# Patient Record
Sex: Male | Born: 1935 | Race: White | Hispanic: No | Marital: Married | Smoking: Never smoker
Health system: Southern US, Community
[De-identification: ages and names within clinical notes are randomized; demographics above are authoritative.]

## PROBLEM LIST (undated history)

## (undated) DIAGNOSIS — M109 Gout, unspecified: Secondary | ICD-10-CM

## (undated) DIAGNOSIS — K921 Melena: Secondary | ICD-10-CM

## (undated) DIAGNOSIS — H409 Unspecified glaucoma: Secondary | ICD-10-CM

## (undated) DIAGNOSIS — M1990 Unspecified osteoarthritis, unspecified site: Secondary | ICD-10-CM

## (undated) DIAGNOSIS — I10 Essential (primary) hypertension: Secondary | ICD-10-CM

## (undated) DIAGNOSIS — E785 Hyperlipidemia, unspecified: Secondary | ICD-10-CM

## (undated) DIAGNOSIS — K5731 Diverticulosis of large intestine without perforation or abscess with bleeding: Secondary | ICD-10-CM

## (undated) HISTORY — DX: Melena: K92.1

## (undated) HISTORY — PX: OTHER SURGICAL HISTORY: SHX169

## (undated) HISTORY — PX: HERNIA REPAIR: SHX51

## (undated) HISTORY — DX: Diverticulosis of large intestine without perforation or abscess with bleeding: K57.31

---

## 2012-12-14 DEATH — deceased

## 2015-11-08 DIAGNOSIS — M51369 Other intervertebral disc degeneration, lumbar region without mention of lumbar back pain or lower extremity pain: Secondary | ICD-10-CM | POA: Insufficient documentation

## 2015-11-08 DIAGNOSIS — R7303 Prediabetes: Secondary | ICD-10-CM

## 2015-11-08 DIAGNOSIS — R972 Elevated prostate specific antigen [PSA]: Secondary | ICD-10-CM | POA: Insufficient documentation

## 2015-11-08 HISTORY — DX: Elevated prostate specific antigen (PSA): R97.20

## 2015-11-08 HISTORY — DX: Prediabetes: R73.03

## 2015-11-08 HISTORY — DX: Other intervertebral disc degeneration, lumbar region without mention of lumbar back pain or lower extremity pain: M51.369

## 2015-11-12 DIAGNOSIS — H5509 Other forms of nystagmus: Secondary | ICD-10-CM

## 2015-11-12 HISTORY — DX: Other forms of nystagmus: H55.09

## 2015-12-24 DIAGNOSIS — G450 Vertebro-basilar artery syndrome: Secondary | ICD-10-CM

## 2015-12-24 DIAGNOSIS — G459 Transient cerebral ischemic attack, unspecified: Secondary | ICD-10-CM | POA: Insufficient documentation

## 2015-12-24 HISTORY — DX: Transient cerebral ischemic attack, unspecified: G45.9

## 2015-12-24 HISTORY — DX: Vertebro-basilar artery syndrome: G45.0

## 2016-02-24 DIAGNOSIS — N401 Enlarged prostate with lower urinary tract symptoms: Secondary | ICD-10-CM

## 2016-02-24 HISTORY — DX: Benign prostatic hyperplasia with lower urinary tract symptoms: N40.1

## 2016-05-11 DIAGNOSIS — K429 Umbilical hernia without obstruction or gangrene: Secondary | ICD-10-CM

## 2016-05-11 DIAGNOSIS — N529 Male erectile dysfunction, unspecified: Secondary | ICD-10-CM

## 2016-05-11 DIAGNOSIS — J309 Allergic rhinitis, unspecified: Secondary | ICD-10-CM | POA: Insufficient documentation

## 2016-05-11 HISTORY — DX: Allergic rhinitis, unspecified: J30.9

## 2016-05-11 HISTORY — DX: Umbilical hernia without obstruction or gangrene: K42.9

## 2016-05-11 HISTORY — DX: Male erectile dysfunction, unspecified: N52.9

## 2016-06-09 DIAGNOSIS — H3530 Unspecified macular degeneration: Secondary | ICD-10-CM

## 2016-06-09 HISTORY — DX: Unspecified macular degeneration: H35.30

## 2016-11-16 DIAGNOSIS — R5383 Other fatigue: Secondary | ICD-10-CM | POA: Insufficient documentation

## 2016-11-16 HISTORY — DX: Other fatigue: R53.83

## 2016-11-17 DIAGNOSIS — D539 Nutritional anemia, unspecified: Secondary | ICD-10-CM | POA: Insufficient documentation

## 2016-11-17 HISTORY — DX: Nutritional anemia, unspecified: D53.9

## 2017-10-06 ENCOUNTER — Encounter (HOSPITAL_COMMUNITY): Payer: Self-pay | Admitting: Internal Medicine

## 2017-10-06 ENCOUNTER — Other Ambulatory Visit: Payer: Self-pay

## 2017-10-06 ENCOUNTER — Inpatient Hospital Stay (HOSPITAL_COMMUNITY)
Admission: AD | Admit: 2017-10-06 | Discharge: 2017-10-09 | DRG: 378 | Disposition: A | Discharge status: Home / Self Care | Payer: Medicare HMO | Source: Other Acute Inpatient Hospital | Attending: Internal Medicine | Admitting: Internal Medicine

## 2017-10-06 DIAGNOSIS — I10 Essential (primary) hypertension: Secondary | ICD-10-CM | POA: Diagnosis present

## 2017-10-06 DIAGNOSIS — I119 Hypertensive heart disease without heart failure: Secondary | ICD-10-CM | POA: Diagnosis present

## 2017-10-06 DIAGNOSIS — K222 Esophageal obstruction: Secondary | ICD-10-CM | POA: Diagnosis present

## 2017-10-06 DIAGNOSIS — K219 Gastro-esophageal reflux disease without esophagitis: Secondary | ICD-10-CM | POA: Diagnosis present

## 2017-10-06 DIAGNOSIS — K922 Gastrointestinal hemorrhage, unspecified: Secondary | ICD-10-CM

## 2017-10-06 DIAGNOSIS — I739 Peripheral vascular disease, unspecified: Secondary | ICD-10-CM | POA: Diagnosis present

## 2017-10-06 DIAGNOSIS — E785 Hyperlipidemia, unspecified: Secondary | ICD-10-CM | POA: Diagnosis present

## 2017-10-06 DIAGNOSIS — K5710 Diverticulosis of small intestine without perforation or abscess without bleeding: Secondary | ICD-10-CM | POA: Diagnosis not present

## 2017-10-06 DIAGNOSIS — I700 Atherosclerosis of aorta: Secondary | ICD-10-CM | POA: Diagnosis present

## 2017-10-06 DIAGNOSIS — K648 Other hemorrhoids: Secondary | ICD-10-CM | POA: Diagnosis not present

## 2017-10-06 DIAGNOSIS — N400 Enlarged prostate without lower urinary tract symptoms: Secondary | ICD-10-CM | POA: Diagnosis not present

## 2017-10-06 DIAGNOSIS — I2510 Atherosclerotic heart disease of native coronary artery without angina pectoris: Secondary | ICD-10-CM | POA: Diagnosis present

## 2017-10-06 DIAGNOSIS — K5791 Diverticulosis of intestine, part unspecified, without perforation or abscess with bleeding: Principal | ICD-10-CM | POA: Diagnosis present

## 2017-10-06 DIAGNOSIS — Z8673 Personal history of transient ischemic attack (TIA), and cerebral infarction without residual deficits: Secondary | ICD-10-CM

## 2017-10-06 DIAGNOSIS — Z7902 Long term (current) use of antithrombotics/antiplatelets: Secondary | ICD-10-CM

## 2017-10-06 DIAGNOSIS — Z803 Family history of malignant neoplasm of breast: Secondary | ICD-10-CM | POA: Diagnosis not present

## 2017-10-06 DIAGNOSIS — K449 Diaphragmatic hernia without obstruction or gangrene: Secondary | ICD-10-CM | POA: Diagnosis present

## 2017-10-06 DIAGNOSIS — M1990 Unspecified osteoarthritis, unspecified site: Secondary | ICD-10-CM | POA: Diagnosis present

## 2017-10-06 DIAGNOSIS — K5731 Diverticulosis of large intestine without perforation or abscess with bleeding: Secondary | ICD-10-CM | POA: Diagnosis not present

## 2017-10-06 DIAGNOSIS — H409 Unspecified glaucoma: Secondary | ICD-10-CM | POA: Diagnosis present

## 2017-10-06 DIAGNOSIS — K921 Melena: Secondary | ICD-10-CM

## 2017-10-06 DIAGNOSIS — M109 Gout, unspecified: Secondary | ICD-10-CM | POA: Diagnosis present

## 2017-10-06 DIAGNOSIS — D62 Acute posthemorrhagic anemia: Secondary | ICD-10-CM | POA: Diagnosis present

## 2017-10-06 DIAGNOSIS — Z7901 Long term (current) use of anticoagulants: Secondary | ICD-10-CM | POA: Diagnosis not present

## 2017-10-06 DIAGNOSIS — I959 Hypotension, unspecified: Secondary | ICD-10-CM | POA: Diagnosis present

## 2017-10-06 HISTORY — DX: Unspecified glaucoma: H40.9

## 2017-10-06 HISTORY — DX: Unspecified osteoarthritis, unspecified site: M19.90

## 2017-10-06 HISTORY — DX: Essential (primary) hypertension: I10

## 2017-10-06 LAB — COMPREHENSIVE METABOLIC PANEL
ALBUMIN: 3.1 g/dL — AB (ref 3.5–5.0)
ALT: 14 U/L — ABNORMAL LOW (ref 17–63)
ANION GAP: 8 (ref 5–15)
AST: 20 U/L (ref 15–41)
Alkaline Phosphatase: 72 U/L (ref 38–126)
BILIRUBIN TOTAL: 0.7 mg/dL (ref 0.3–1.2)
BUN: 30 mg/dL — ABNORMAL HIGH (ref 6–20)
CO2: 20 mmol/L — ABNORMAL LOW (ref 22–32)
Calcium: 8.1 mg/dL — ABNORMAL LOW (ref 8.9–10.3)
Chloride: 107 mmol/L (ref 101–111)
Creatinine, Ser: 1.06 mg/dL (ref 0.61–1.24)
GFR calc Af Amer: >60 mL/min (ref >60)
GLUCOSE: 140 mg/dL — AB (ref 65–99)
POTASSIUM: 4.2 mmol/L (ref 3.5–5.1)
Sodium: 135 mmol/L (ref 135–145)
TOTAL PROTEIN: 5.2 g/dL — AB (ref 6.5–8.1)

## 2017-10-06 LAB — CBC WITH DIFFERENTIAL/PLATELET
Basophils Absolute: 0 10*3/uL (ref 0.0–0.1)
Basophils Relative: 0 %
Eosinophils Absolute: 0 10*3/uL (ref 0.0–0.7)
Eosinophils Relative: 0 %
HEMATOCRIT: 31.2 % — AB (ref 39.0–52.0)
Hemoglobin: 10.5 g/dL — ABNORMAL LOW (ref 13.0–17.0)
Lymphocytes Relative: 10 %
Lymphs Abs: 1.3 10*3/uL (ref 0.7–4.0)
MCH: 31.5 pg (ref 26.0–34.0)
MCHC: 33.7 g/dL (ref 30.0–36.0)
MCV: 93.7 fL (ref 78.0–100.0)
MONO ABS: 0.4 10*3/uL (ref 0.1–1.0)
MONOS PCT: 3 %
NEUTROS ABS: 11.2 10*3/uL — AB (ref 1.7–7.7)
Neutrophils Relative %: 87 %
Platelets: 181 10*3/uL (ref 150–400)
RBC: 3.33 MIL/uL — ABNORMAL LOW (ref 4.22–5.81)
RDW: 13.6 % (ref 11.5–15.5)
WBC: 12.9 10*3/uL — ABNORMAL HIGH (ref 4.0–10.5)

## 2017-10-06 LAB — LACTIC ACID, PLASMA: LACTIC ACID, VENOUS: 1.3 mmol/L (ref 0.5–1.9)

## 2017-10-06 LAB — PROTIME-INR
INR: 1.11
PROTHROMBIN TIME: 14.2 s (ref 11.4–15.2)

## 2017-10-06 LAB — ABO/RH: ABO/RH(D): AB POS

## 2017-10-06 NOTE — Progress Notes (Signed)
New Admission Note:  Arrival Method: Via [facility] on a stretcher.  Mental Orientation: Alert & Oriented x4. Telemetry: CCMD verified.  Assessment: Completed Skin: Refer to flowsheet.  IV: Left Forearm Pain: 0/10 Tubes: Safety Measures: Safety Fall Prevention Plan discussed with patient. Admission: Completed [facility] [place] Orientation: Patient has been orientated to the room, unit and the staff. Family: Son at the bedside.   Orders have been reviewed and implemented. Will continue to monitor the patient. Call light has been placed within reach and bed alarm has been activated.   Aram CandelaJequetta Cole Eastridge, RN  Phone Number: [phone]

## 2017-10-07 ENCOUNTER — Encounter (HOSPITAL_COMMUNITY)
Admission: AD | Disposition: A | Discharge status: Home / Self Care | Payer: Self-pay | Source: Other Acute Inpatient Hospital | Attending: Internal Medicine

## 2017-10-07 ENCOUNTER — Inpatient Hospital Stay (HOSPITAL_COMMUNITY): Payer: Medicare HMO

## 2017-10-07 ENCOUNTER — Encounter (HOSPITAL_COMMUNITY): Payer: Self-pay | Admitting: Internal Medicine

## 2017-10-07 ENCOUNTER — Inpatient Hospital Stay (HOSPITAL_COMMUNITY): Payer: Medicare HMO | Admitting: Certified Registered Nurse Anesthetist

## 2017-10-07 DIAGNOSIS — K449 Diaphragmatic hernia without obstruction or gangrene: Secondary | ICD-10-CM

## 2017-10-07 DIAGNOSIS — D62 Acute posthemorrhagic anemia: Secondary | ICD-10-CM

## 2017-10-07 DIAGNOSIS — E785 Hyperlipidemia, unspecified: Secondary | ICD-10-CM

## 2017-10-07 DIAGNOSIS — M109 Gout, unspecified: Secondary | ICD-10-CM | POA: Diagnosis present

## 2017-10-07 DIAGNOSIS — K922 Gastrointestinal hemorrhage, unspecified: Secondary | ICD-10-CM

## 2017-10-07 DIAGNOSIS — I10 Essential (primary) hypertension: Secondary | ICD-10-CM | POA: Diagnosis present

## 2017-10-07 DIAGNOSIS — K222 Esophageal obstruction: Secondary | ICD-10-CM

## 2017-10-07 DIAGNOSIS — Z7901 Long term (current) use of anticoagulants: Secondary | ICD-10-CM

## 2017-10-07 HISTORY — DX: Acute posthemorrhagic anemia: D62

## 2017-10-07 HISTORY — DX: Hyperlipidemia, unspecified: E78.5

## 2017-10-07 HISTORY — DX: Gastrointestinal hemorrhage, unspecified: K92.2

## 2017-10-07 HISTORY — PX: ESOPHAGOGASTRODUODENOSCOPY: SHX5428

## 2017-10-07 LAB — GLUCOSE, CAPILLARY
GLUCOSE-CAPILLARY: 142 mg/dL — AB (ref 65–99)
Glucose-Capillary: 123 mg/dL — ABNORMAL HIGH (ref 65–99)
Glucose-Capillary: 129 mg/dL — ABNORMAL HIGH (ref 65–99)
Glucose-Capillary: 140 mg/dL — ABNORMAL HIGH (ref 65–99)
Glucose-Capillary: 147 mg/dL — ABNORMAL HIGH (ref 65–99)

## 2017-10-07 LAB — CBC
HCT: 28.8 % — ABNORMAL LOW (ref 39.0–52.0)
HCT: 26 % — ABNORMAL LOW (ref 39.0–52.0)
HCT: 25.2 % — ABNORMAL LOW (ref 39.0–52.0)
HEMATOCRIT: 26 % — AB (ref 39.0–52.0)
HEMOGLOBIN: 9.8 g/dL — AB (ref 13.0–17.0)
HEMOGLOBIN: 8.6 g/dL — AB (ref 13.0–17.0)
Hemoglobin: 9.1 g/dL — ABNORMAL LOW (ref 13.0–17.0)
Hemoglobin: 8.5 g/dL — ABNORMAL LOW (ref 13.0–17.0)
MCH: 31.7 pg (ref 26.0–34.0)
MCH: 33 pg (ref 26.0–34.0)
MCH: 31.8 pg (ref 26.0–34.0)
MCH: 31.5 pg (ref 26.0–34.0)
MCHC: 34 g/dL (ref 30.0–36.0)
MCHC: 35 g/dL (ref 30.0–36.0)
MCHC: 33.7 g/dL (ref 30.0–36.0)
MCHC: 33.1 g/dL (ref 30.0–36.0)
MCV: 93.2 fL (ref 78.0–100.0)
MCV: 94.2 fL (ref 78.0–100.0)
MCV: 94.4 fL (ref 78.0–100.0)
MCV: 95.2 fL (ref 78.0–100.0)
PLATELETS: 191 10*3/uL (ref 150–400)
PLATELETS: 170 10*3/uL (ref 150–400)
PLATELETS: 172 10*3/uL (ref 150–400)
Platelets: 170 10*3/uL (ref 150–400)
RBC: 3.09 MIL/uL — ABNORMAL LOW (ref 4.22–5.81)
RBC: 2.76 MIL/uL — AB (ref 4.22–5.81)
RBC: 2.67 MIL/uL — AB (ref 4.22–5.81)
RBC: 2.73 MIL/uL — ABNORMAL LOW (ref 4.22–5.81)
RDW: 13.7 % (ref 11.5–15.5)
RDW: 14 % (ref 11.5–15.5)
RDW: 13.7 % (ref 11.5–15.5)
RDW: 13.9 % (ref 11.5–15.5)
WBC: 14.1 10*3/uL — ABNORMAL HIGH (ref 4.0–10.5)
WBC: 12.4 10*3/uL — ABNORMAL HIGH (ref 4.0–10.5)
WBC: 13 10*3/uL — AB (ref 4.0–10.5)
WBC: 11.8 10*3/uL — AB (ref 4.0–10.5)

## 2017-10-07 LAB — BASIC METABOLIC PANEL
ANION GAP: 10 (ref 5–15)
BUN: 33 mg/dL — ABNORMAL HIGH (ref 6–20)
CO2: 19 mmol/L — ABNORMAL LOW (ref 22–32)
Calcium: 8.4 mg/dL — ABNORMAL LOW (ref 8.9–10.3)
Chloride: 110 mmol/L (ref 101–111)
Creatinine, Ser: 1.04 mg/dL (ref 0.61–1.24)
GFR calc Af Amer: >60 mL/min (ref >60)
GLUCOSE: 144 mg/dL — AB (ref 65–99)
Potassium: 4 mmol/L (ref 3.5–5.1)
Sodium: 139 mmol/L (ref 135–145)

## 2017-10-07 LAB — PREPARE RBC (CROSSMATCH)

## 2017-10-07 SURGERY — EGD (ESOPHAGOGASTRODUODENOSCOPY)
Anesthesia: Monitor Anesthesia Care

## 2017-10-07 MED ORDER — ALLOPURINOL 300 MG PO TABS
300.0000 mg | ORAL_TABLET | Freq: Two times a day (BID) | ORAL | Status: DC
  Administered 2017-10-07 – 2017-10-09 (×5): 300 mg via ORAL
  Filled 2017-10-07 (×5): qty 1

## 2017-10-07 MED ORDER — PROPOFOL 500 MG/50ML IV EMUL
INTRAVENOUS | Status: DC | PRN
  Administered 2017-10-07: 85 ug/kg/min via INTRAVENOUS

## 2017-10-07 MED ORDER — PEG-KCL-NACL-NASULF-NA ASC-C 100 G PO SOLR
0.5000 | Freq: Once | ORAL | Status: AC
  Administered 2017-10-07: 100 g via ORAL
  Filled 2017-10-07: qty 1

## 2017-10-07 MED ORDER — ACETAMINOPHEN 325 MG PO TABS: 650.0000 mg | ORAL_TABLET | Freq: Four times a day (QID) | ORAL | Status: DC | PRN

## 2017-10-07 MED ORDER — LABETALOL HCL 300 MG PO TABS: 300.0000 mg | ORAL_TABLET | Freq: Two times a day (BID) | ORAL | Status: DC

## 2017-10-07 MED ORDER — BRIMONIDINE TARTRATE 0.2 % OP SOLN
1.0000 [drp] | Freq: Two times a day (BID) | OPHTHALMIC | Status: DC
  Administered 2017-10-07 – 2017-10-09 (×5): 1 [drp] via OPHTHALMIC
  Filled 2017-10-07: qty 5

## 2017-10-07 MED ORDER — ONDANSETRON HCL 4 MG PO TABS: 4.0000 mg | ORAL_TABLET | Freq: Four times a day (QID) | ORAL | Status: DC | PRN

## 2017-10-07 MED ORDER — PEG-KCL-NACL-NASULF-NA ASC-C 100 G PO SOLR: 1.0000 | Freq: Once | ORAL | Status: DC

## 2017-10-07 MED ORDER — LACTATED RINGERS IV SOLN
INTRAVENOUS | Status: AC | PRN
  Administered 2017-10-07: 11:00:00 via INTRAVENOUS
  Administered 2017-10-07: 1000 mL via INTRAVENOUS

## 2017-10-07 MED ORDER — HYDRALAZINE HCL 20 MG/ML IJ SOLN: 10.0000 mg | INTRAMUSCULAR | Status: DC | PRN

## 2017-10-07 MED ORDER — LATANOPROST 0.005 % OP SOLN
1.0000 [drp] | Freq: Every day | OPHTHALMIC | Status: DC
  Administered 2017-10-07 – 2017-10-08 (×2): 1 [drp] via OPHTHALMIC
  Filled 2017-10-07: qty 2.5

## 2017-10-07 MED ORDER — ONDANSETRON HCL 4 MG/2ML IJ SOLN: 4.0000 mg | Freq: Four times a day (QID) | INTRAMUSCULAR | Status: DC | PRN

## 2017-10-07 MED ORDER — PHENYLEPHRINE HCL 10 MG/ML IJ SOLN
INTRAMUSCULAR | Status: DC | PRN
  Administered 2017-10-07: 200 ug via INTRAVENOUS
  Administered 2017-10-07: 120 ug via INTRAVENOUS

## 2017-10-07 MED ORDER — SODIUM CHLORIDE 0.9 % IV SOLN: Freq: Once | INTRAVENOUS | Status: DC

## 2017-10-07 MED ORDER — DORZOLAMIDE HCL-TIMOLOL MAL 2-0.5 % OP SOLN
1.0000 [drp] | Freq: Two times a day (BID) | OPHTHALMIC | Status: DC
  Administered 2017-10-07 – 2017-10-09 (×5): 1 [drp] via OPHTHALMIC
  Filled 2017-10-07: qty 10

## 2017-10-07 MED ORDER — IOPAMIDOL (ISOVUE-370) INJECTION 76%
INTRAVENOUS | Status: AC
  Administered 2017-10-07: 100 mL
  Filled 2017-10-07: qty 100

## 2017-10-07 MED ORDER — LABETALOL HCL 300 MG PO TABS
300.0000 mg | ORAL_TABLET | Freq: Two times a day (BID) | ORAL | Status: DC
  Administered 2017-10-07 – 2017-10-09 (×5): 300 mg via ORAL
  Filled 2017-10-07 (×5): qty 1

## 2017-10-07 MED ORDER — PEG-KCL-NACL-NASULF-NA ASC-C 100 G PO SOLR
0.5000 | Freq: Once | ORAL | Status: AC
  Administered 2017-10-08: 100 g via ORAL
  Filled 2017-10-07: qty 1

## 2017-10-07 MED ORDER — ACETAMINOPHEN 650 MG RE SUPP: 650.0000 mg | Freq: Four times a day (QID) | RECTAL | Status: DC | PRN

## 2017-10-07 MED ORDER — SODIUM CHLORIDE 0.9 % IV SOLN: INTRAVENOUS | Status: DC

## 2017-10-07 MED ORDER — PROPOFOL 10 MG/ML IV BOLUS
INTRAVENOUS | Status: DC | PRN
  Administered 2017-10-07: 20 mg via INTRAVENOUS
  Administered 2017-10-07: 10 mg via INTRAVENOUS

## 2017-10-07 MED ORDER — DEXTROSE-NACL 5-0.9 % IV SOLN
INTRAVENOUS | Status: AC
  Administered 2017-10-07: 01:00:00 via INTRAVENOUS

## 2017-10-07 NOTE — Op Note (Signed)
[facility] Patient Name: Steven Dodson Procedure Date : 10/07/2017 MRN: [id number] Attending MD: Estill Cotta. Loletha Carrow , MD Date of Birth: 1932/06/13 CSN: [id number] Age: 84 Admit Type: Inpatient Procedure:                Upper GI endoscopy Indications:              Hematochezia, Gastrointestinal bleeding of unknown                            origin Providers:                Mallie Mussel L. Loletha Carrow, MD, Kingsley Plan, RN, Cherylynn Ridges, Technician Referring MD:              Medicines:                Monitored Anesthesia Care Complications:            No immediate complications. Estimated Blood Loss:     Estimated blood loss: none. Procedure:                Pre-Anesthesia Assessment:                           - Prior to the procedure, a History and Physical                            was performed, and patient medications and                            allergies were reviewed. The patient's tolerance of                            previous anesthesia was also reviewed. The risks                            and benefits of the procedure and the sedation                            options and risks were discussed with the patient.                            All questions were answered, and informed consent                            was obtained. Prior Anticoagulants: The patient has                            taken Plavix (clopidogrel), last dose was 2 days                            prior to procedure. ASA Grade Assessment: III - A  patient with severe systemic disease. After                            reviewing the risks and benefits, the patient was                            deemed in satisfactory condition to undergo the                            procedure.                           After obtaining informed consent, the endoscope was                            passed under direct vision. Throughout the     procedure, the patient's blood pressure, pulse, and                            oxygen saturations were monitored continuously. The                            EG-[id number] ([id number]) scope was introduced through the                            mouth, and advanced to the second part of duodenum.                            The upper GI endoscopy was accomplished without                            difficulty. The patient tolerated the procedure                            well. Scope In: Scope Out: Findings:      The larynx was normal.      A medium-sized hiatal hernia was present.      One moderate benign-appearing, intrinsic stenosis was found at the       gastroesophageal junction. This measured 1.2 cm (inner diameter) and was       traversed.      The stomach was normal.      The cardia and gastric fundus were normal on retroflexion.      A diverticulum was found in the area of the papilla. Impression:               - Normal larynx.                           - Medium-sized hiatal hernia.                           - Benign-appearing esophageal stenosis.                           - Normal stomach.                           -  Duodenal diverticulum.                           - No specimens collected. Moderate Sedation:      MAC sedation used Recommendation:           - Return patient to hospital ward for ongoing care.                           - Clear liquid diet today.                           Transfuse one dose platelets today (patient still                            has anti-platelet effect of recent plavix dose)                           - CT angiogram today - if localizes bleeding                            source, proceed to interventional radiology.                           - Perform a colonoscopy tomorrow if CTA negative.                           - Continue present medications. Procedure Code(s):        --- Professional ---                           [phone],  Esophagogastroduodenoscopy, flexible,                            transoral; diagnostic, including collection of                            specimen(s) by brushing or washing, when performed                            (separate procedure) Diagnosis Code(s):        --- Professional ---                           K44.9, Diaphragmatic hernia without obstruction or                            gangrene                           K22.2, Esophageal obstruction                           K92.1, Melena (includes Hematochezia)                           K92.2, Gastrointestinal hemorrhage, unspecified CPT copyright 2016 American Medical Association. All rights reserved. The codes documented  in this report are preliminary and upon coder review may  be revised to meet current compliance requirements. Beza Steppe L. Loletha Carrow, MD 10/07/2017 11:38:07 AM This report has been signed electronically. Number of Addenda: 0

## 2017-10-07 NOTE — Interval H&P Note (Signed)
History and Physical Interval Note:  10/07/2017 10:52 AM  Steven Dodson  has presented today for surgery, with the diagnosis of melena, burgundy bloody stool.  anemia  The various methods of treatment have been discussed with the patient and family. After consideration of risks, benefits and other options for treatment, the patient has consented to  Procedure(s): ESOPHAGOGASTRODUODENOSCOPY (EGD) (N/A) as a surgical intervention .  The patient's history has been reviewed, patient examined, no change in status, stable for surgery.  I have reviewed the patient's chart and labs.  Questions were answered to the patient's satisfaction.     Charlie PitterHenry L Danis III

## 2017-10-07 NOTE — Consult Note (Addendum)
[facility] Consult: 9:40 AM 10/07/2017  LOS: 1 day    Referring Provider: Dr Elvera Lennox  Primary Care Physician:  [name], MD Primary Gastroenterologist:  Gentry Fitz.       Reason for Consultation:  [place] stool.     HPI: Steven Dodson is a 84 y.o. male.  PMH Htn.  O/A.  Take 1 Aleve daily for left leg pain.  Takes Plavix but no ASA for history of vascular disease.  BPH.    S/p  3 separate inguinal hernia repairs bil and inininal hernia repair.   Remote colonoscopies x 2, last was 2000 at which time he had diverticulosis.  Previous hemorrhoidal bleeding.  Never had colon polyps.    Patient presented to [facility] yesterday and transferred to [facility] overnight due to lack of GI coverage in [place]. ~ 7:30 AM he had first of 4 or 5 black/burgundy watery stools.  Felt presyncopal/dizzy and got on his knees and called 911.  No CP or SOB.  Went to [facility] ED.  Has had several more, totaling at least 10 episodes of similar stools at ED and here at [facility] hospital where he transferred last PM.  Latest stools soft, smaller but still dark red.  No abd pain other than twinges of left side cramps just before a BM.  No reflux sxs at home.  No n/v.  A couple of days ago strained a bit while having otherwise normal, formed, [place] stool.  Hemorrhoidal bleeding was issue 10 years ago.  Weight, appetite stable.  No previous anemia or blood loss issues.  Family hx + for breast Ca in mother but no colon cancer.   Takes the Aleve 1 x daily, no ETOH.   Blood pressures and heart rate stable.  Hgb 12.8.  WBCs 14.3.  PT/INR normal.  BUN elevated at 25 with normal creatinine. At 3 AM this morning at [facility] his Hgb 9.8.  3 hours later it was 9.1.  No thrombocytopenia.  BUN is 33.  Acute abdominal series  with chest films showed cardiomegaly, no heart failure.  Aortic atherosclerosis.  Normal bowel gas pattern.    Past Medical History:  Diagnosis Date  . Arthritis   . Hypertension     Past Surgical History:  Procedure Laterality Date  . HERNIA REPAIR      Prior to Admission medications   Medication Sig Start Date End Date Taking? Authorizing Provider  allopurinol (ZYLOPRIM) 300 MG tablet Take 300 mg by mouth 2 (two) times daily.   Yes [provider]  brimonidine (ALPHAGAN) 0.2 % ophthalmic solution Place 1 drop into both eyes 2 (two) times daily with breakfast and lunch.   Yes [provider]  clopidogrel (PLAVIX) 75 MG tablet Take 75 mg by mouth at bedtime.   Yes [provider]  dorzolamide-timolol (COSOPT) 22.3-6.8 MG/ML ophthalmic solution Place 1 drop into both eyes 2 (two) times daily with breakfast and lunch. With breakfast and lunch   Yes [provider]  labetalol (NORMODYNE) 300 MG tablet Take 300 mg by  mouth 2 (two) times daily.   Yes [provider]  latanoprost (XALATAN) 0.005 % ophthalmic solution Place 1 drop into both eyes at bedtime.   Yes [provider]  lisinopril-hydrochlorothiazide (PRINZIDE,ZESTORETIC) 20-25 MG tablet Take 1 tablet by mouth daily.   Yes [provider]  Multiple Vitamin (MULTIVITAMIN WITH MINERALS) TABS tablet Take 1 tablet by mouth daily.   Yes [provider]  naproxen sodium (ALEVE) 220 MG tablet Take 220 mg by mouth daily.   Yes [provider]  pravastatin (PRAVACHOL) 40 MG tablet Take 40 mg by mouth at bedtime.   Yes [provider]  indomethacin (INDOCIN) 50 MG capsule Take 50 mg by mouth 3 (three) times daily as needed (gout flare).    [provider]    Scheduled Meds: . allopurinol  300 mg Oral BID  . brimonidine  1 drop Both Eyes BID WC  . dorzolamide-timolol  1 drop Both Eyes BID WC  . labetalol  300 mg Oral BID  . latanoprost  1 drop  Both Eyes QHS   Infusions: . dextrose 5 % and 0.9% NaCl 75 mL/hr at 10/07/17 0039   PRN Meds: acetaminophen **OR** acetaminophen, hydrALAZINE, ondansetron **OR** ondansetron (ZOFRAN) IV   Allergies as of 10/06/2017  . (No Known Allergies)    Family History  Problem Relation Age of Onset  . Colon cancer Neg Hx     Social History   Socioeconomic History  . Marital status: Married    Spouse name: Not on file  . Number of children: Not on file  . Years of education: Not on file  . Highest education level: Not on file  Social Needs  . Financial resource strain: Not on file  . Food insecurity - worry: Not on file  . Food insecurity - inability: Not on file  . Transportation needs - medical: Not on file  . Transportation needs - non-medical: Not on file  Occupational History  . Not on file  Tobacco Use  . Smoking status: Never Smoker  . Smokeless tobacco: Never Used  Substance and Sexual Activity  . Alcohol use: No    Frequency: Never  . Drug use: No  . Sexual activity: Not on file  Other Topics Concern  . Not on file  Social History Narrative  . Not on file    REVIEW OF SYSTEMS: Constitutional:  Per HPI.  Normally no issues with fatigue or weakness ENT:  No nose bleeds Pulm:  No SOB or cough CV:  No palpitations, no LE edema.  GU:  No hematuria, no frequency GI:  Per HPI Heme:  Per HPI   Transfusions:  None ever Neuro:  Pre-syncope resolved Derm:  No itching, no rash or sores.  Endocrine:  No sweats or chills.  No polyuria or dysuria Immunization:  Did not inguire    PHYSICAL EXAM: Vital signs in last 24 hours: Vitals:   10/07/17 0447 10/07/17 0921  BP: (!) 163/63 (!) 142/64  Pulse: 94 85  Resp: 16 18  Temp: 98.5 F (36.9 C) 98.6 F (37 C)  SpO2: 97% 99%   Wt Readings from Last 3 Encounters:  10/07/17 83.1 kg (183 lb 3.2 oz)   General: Elderly, pale but underneath this does not look frail or ill  Head:  No asymmetry, signs of trauma or  swelling  Eyes:  No icterus or pallor Ears:  Not HOH  Nose:  No discharge Mouth:  Moist, clear.  Good dental repair.  Tongue  midlinge Neck:  No mass, no TMG, no bruits Lungs:  Clear bil.  No labored breathing or cough Heart: RRR Abdomen:  Soft, protuberant, active BS.  NT.  No HSM or mass.   Rectal: burgundy, melenic-smelling liquid stool.  No masses in rectum   Musc/Skeltl: no joint swelling, redness, or deformity Extremities:  No CCE  Neurologic:  Oriented x 3.  Excellent historian.  Moves all 4 limbs, no tremors Skin:  No telangectasia, rashes, sores Tattoos:  none Nodes:  No cervical adenopathy.     Psych:  Pleasant, calm, cooperative.  Fluid speech.    Intake/Output from previous day: 02/21 0701 - 02/22 0700 In: 402.5 [I.V.:402.5] Out: -  Intake/Output this shift: No intake/output data recorded.  LAB RESULTS: Recent Labs    10/06/17 2143 10/07/17 0311 10/07/17 0633  WBC 12.9* 14.1* 12.4*  HGB 10.5* 9.8* 9.1*  HCT 31.2* 28.8* 26.0*  PLT 181 191 170   BMET Lab Results  Component Value Date   NA 139 10/07/2017   NA 135 10/06/2017   K 4.0 10/07/2017   K 4.2 10/06/2017   CL 110 10/07/2017   CL 107 10/06/2017   CO2 19 (L) 10/07/2017   CO2 20 (L) 10/06/2017   GLUCOSE 144 (H) 10/07/2017   GLUCOSE 140 (H) 10/06/2017   BUN 33 (H) 10/07/2017   BUN 30 (H) 10/06/2017   CREATININE 1.04 10/07/2017   CREATININE 1.06 10/06/2017   CALCIUM 8.4 (L) 10/07/2017   CALCIUM 8.1 (L) 10/06/2017   LFT Recent Labs    10/06/17 2143  PROT 5.2*  ALBUMIN 3.1*  AST 20  ALT 14*  ALKPHOS 72  BILITOT 0.7   PT/INR Lab Results  Component Value Date   INR 1.11 10/06/2017     IMPRESSION:   *   GI bleed/hematochezia.  Suspect UGI source with the elevated BUN and appearance/odor of stools.   ? Due to NSAID, takes 1 Aleve daily.   Not on PPI at home or now.    *  Chronic Plavix, last dose was 2/20.  For "vascular disease".    *   Blood loss anemia.  Not needing transfusion  yet    PLAN:     *   EGD ~ 11AM today.   Based on findings, can initiate Protonix if indicated.      Jennye Moccasin  10/07/2017, 9:40 AM Pager: [phone]  I have reviewed the entire case in detail with the above APP and discussed the plan in detail.  Therefore, I agree with the diagnoses recorded above. In addition,  I have personally interviewed and examined the patient and have personally reviewed any abdominal/pelvic CT scan images.  My additional thoughts are as follows:  Difficult to tell if upper or lower source of GI bleeding.  Given amount of blood loss in last 24 hrs, I am concerned it may be upper.  Indication for Plavix unclear without outside records.  Proceed to EGD today.  The benefits and risks of the planned procedure were described in detail with the patient or (when appropriate) their health care proxy.  Risks were outlined as including, but not limited to, bleeding, infection, perforation, adverse medication reaction leading to cardiac or pulmonary decompensation, or pancreatitis (if ERCP).  The limitation of incomplete mucosal visualization was also discussed.  No guarantees or warranties were given. Patient at increased risk for cardiopulmonary complications of procedure due to medical comorbidities.   If negative, colonoscopy tomorrow.    Serial hgb and Hct, transfuse  to keep Hgb > 7.5  Charlie PitterHenry L Danis III Pager [phone]  Mon-Fri 8a-5p [phone] after 5p, weekends, holidays

## 2017-10-07 NOTE — H&P (View-Only) (Signed)
[facility] Consult: 9:40 AM 10/07/2017  LOS: 1 day    Referring Provider: Dr Elvera Lennox  Primary Care Physician:  [name], MD Primary Gastroenterologist:  Gentry Fitz.       Reason for Consultation:  [place] stool.     HPI: Steven Dodson is a 84 y.o. male.  PMH Htn.  O/A.  Take 1 Aleve daily for left leg pain.  Takes Plavix but no ASA for history of vascular disease.  BPH.    S/p  3 separate inguinal hernia repairs bil and inininal hernia repair.   Remote colonoscopies x 2, last was 2000 at which time he had diverticulosis.  Previous hemorrhoidal bleeding.  Never had colon polyps.    Patient presented to [facility] yesterday and transferred to [facility] overnight due to lack of GI coverage in [place]. ~ 7:30 AM he had first of 4 or 5 black/burgundy watery stools.  Felt presyncopal/dizzy and got on his knees and called 911.  No CP or SOB.  Went to [facility] ED.  Has had several more, totaling at least 10 episodes of similar stools at ED and here at [facility] hospital where he transferred last PM.  Latest stools soft, smaller but still dark red.  No abd pain other than twinges of left side cramps just before a BM.  No reflux sxs at home.  No n/v.  A couple of days ago strained a bit while having otherwise normal, formed, [place] stool.  Hemorrhoidal bleeding was issue 10 years ago.  Weight, appetite stable.  No previous anemia or blood loss issues.  Family hx + for breast Ca in mother but no colon cancer.   Takes the Aleve 1 x daily, no ETOH.   Blood pressures and heart rate stable.  Hgb 12.8.  WBCs 14.3.  PT/INR normal.  BUN elevated at 25 with normal creatinine. At 3 AM this morning at [facility] his Hgb 9.8.  3 hours later it was 9.1.  No thrombocytopenia.  BUN is 33.  Acute abdominal series  with chest films showed cardiomegaly, no heart failure.  Aortic atherosclerosis.  Normal bowel gas pattern.    Past Medical History:  Diagnosis Date  . Arthritis   . Hypertension     Past Surgical History:  Procedure Laterality Date  . HERNIA REPAIR      Prior to Admission medications   Medication Sig Start Date End Date Taking? Authorizing Provider  allopurinol (ZYLOPRIM) 300 MG tablet Take 300 mg by mouth 2 (two) times daily.   Yes [provider]  brimonidine (ALPHAGAN) 0.2 % ophthalmic solution Place 1 drop into both eyes 2 (two) times daily with breakfast and lunch.   Yes [provider]  clopidogrel (PLAVIX) 75 MG tablet Take 75 mg by mouth at bedtime.   Yes [provider]  dorzolamide-timolol (COSOPT) 22.3-6.8 MG/ML ophthalmic solution Place 1 drop into both eyes 2 (two) times daily with breakfast and lunch. With breakfast and lunch   Yes [provider]  labetalol (NORMODYNE) 300 MG tablet Take 300 mg by  mouth 2 (two) times daily.   Yes [provider]  latanoprost (XALATAN) 0.005 % ophthalmic solution Place 1 drop into both eyes at bedtime.   Yes [provider]  lisinopril-hydrochlorothiazide (PRINZIDE,ZESTORETIC) 20-25 MG tablet Take 1 tablet by mouth daily.   Yes [provider]  Multiple Vitamin (MULTIVITAMIN WITH MINERALS) TABS tablet Take 1 tablet by mouth daily.   Yes [provider]  naproxen sodium (ALEVE) 220 MG tablet Take 220 mg by mouth daily.   Yes [provider]  pravastatin (PRAVACHOL) 40 MG tablet Take 40 mg by mouth at bedtime.   Yes [provider]  indomethacin (INDOCIN) 50 MG capsule Take 50 mg by mouth 3 (three) times daily as needed (gout flare).    [provider]    Scheduled Meds: . allopurinol  300 mg Oral BID  . brimonidine  1 drop Both Eyes BID WC  . dorzolamide-timolol  1 drop Both Eyes BID WC  . labetalol  300 mg Oral BID  . latanoprost  1 drop  Both Eyes QHS   Infusions: . dextrose 5 % and 0.9% NaCl 75 mL/hr at 10/07/17 0039   PRN Meds: acetaminophen **OR** acetaminophen, hydrALAZINE, ondansetron **OR** ondansetron (ZOFRAN) IV   Allergies as of 10/06/2017  . (No Known Allergies)    Family History  Problem Relation Age of Onset  . Colon cancer Neg Hx     Social History   Socioeconomic History  . Marital status: Married    Spouse name: Not on file  . Number of children: Not on file  . Years of education: Not on file  . Highest education level: Not on file  Social Needs  . Financial resource strain: Not on file  . Food insecurity - worry: Not on file  . Food insecurity - inability: Not on file  . Transportation needs - medical: Not on file  . Transportation needs - non-medical: Not on file  Occupational History  . Not on file  Tobacco Use  . Smoking status: Never Smoker  . Smokeless tobacco: Never Used  Substance and Sexual Activity  . Alcohol use: No    Frequency: Never  . Drug use: No  . Sexual activity: Not on file  Other Topics Concern  . Not on file  Social History Narrative  . Not on file    REVIEW OF SYSTEMS: Constitutional:  Per HPI.  Normally no issues with fatigue or weakness ENT:  No nose bleeds Pulm:  No SOB or cough CV:  No palpitations, no LE edema.  GU:  No hematuria, no frequency GI:  Per HPI Heme:  Per HPI   Transfusions:  None ever Neuro:  Pre-syncope resolved Derm:  No itching, no rash or sores.  Endocrine:  No sweats or chills.  No polyuria or dysuria Immunization:  Did not inguire    PHYSICAL EXAM: Vital signs in last 24 hours: Vitals:   10/07/17 0447 10/07/17 0921  BP: (!) 163/63 (!) 142/64  Pulse: 94 85  Resp: 16 18  Temp: 98.5 F (36.9 C) 98.6 F (37 C)  SpO2: 97% 99%   Wt Readings from Last 3 Encounters:  10/07/17 83.1 kg (183 lb 3.2 oz)   General: Elderly, pale but underneath this does not look frail or ill  Head:  No asymmetry, signs of trauma or  swelling  Eyes:  No icterus or pallor Ears:  Not HOH  Nose:  No discharge Mouth:  Moist, clear.  Good dental repair.  Tongue  midlinge Neck:  No mass, no TMG, no bruits Lungs:  Clear bil.  No labored breathing or cough Heart: RRR Abdomen:  Soft, protuberant, active BS.  NT.  No HSM or mass.   Rectal: burgundy, melenic-smelling liquid stool.  No masses in rectum   Musc/Skeltl: no joint swelling, redness, or deformity Extremities:  No CCE  Neurologic:  Oriented x 3.  Excellent historian.  Moves all 4 limbs, no tremors Skin:  No telangectasia, rashes, sores Tattoos:  none Nodes:  No cervical adenopathy.     Psych:  Pleasant, calm, cooperative.  Fluid speech.    Intake/Output from previous day: 02/21 0701 - 02/22 0700 In: 402.5 [I.V.:402.5] Out: -  Intake/Output this shift: No intake/output data recorded.  LAB RESULTS: Recent Labs    10/06/17 2143 10/07/17 0311 10/07/17 0633  WBC 12.9* 14.1* 12.4*  HGB 10.5* 9.8* 9.1*  HCT 31.2* 28.8* 26.0*  PLT 181 191 170   BMET Lab Results  Component Value Date   NA 139 10/07/2017   NA 135 10/06/2017   K 4.0 10/07/2017   K 4.2 10/06/2017   CL 110 10/07/2017   CL 107 10/06/2017   CO2 19 (L) 10/07/2017   CO2 20 (L) 10/06/2017   GLUCOSE 144 (H) 10/07/2017   GLUCOSE 140 (H) 10/06/2017   BUN 33 (H) 10/07/2017   BUN 30 (H) 10/06/2017   CREATININE 1.04 10/07/2017   CREATININE 1.06 10/06/2017   CALCIUM 8.4 (L) 10/07/2017   CALCIUM 8.1 (L) 10/06/2017   LFT Recent Labs    10/06/17 2143  PROT 5.2*  ALBUMIN 3.1*  AST 20  ALT 14*  ALKPHOS 72  BILITOT 0.7   PT/INR Lab Results  Component Value Date   INR 1.11 10/06/2017     IMPRESSION:   *   GI bleed/hematochezia.  Suspect UGI source with the elevated BUN and appearance/odor of stools.   ? Due to NSAID, takes 1 Aleve daily.   Not on PPI at home or now.    *  Chronic Plavix, last dose was 2/20.  For "vascular disease".    *   Blood loss anemia.  Not needing transfusion  yet    PLAN:     *   EGD ~ 11AM today.   Based on findings, can initiate Protonix if indicated.      Jennye Moccasin  10/07/2017, 9:40 AM Pager: [phone]  I have reviewed the entire case in detail with the above APP and discussed the plan in detail.  Therefore, I agree with the diagnoses recorded above. In addition,  I have personally interviewed and examined the patient and have personally reviewed any abdominal/pelvic CT scan images.  My additional thoughts are as follows:  Difficult to tell if upper or lower source of GI bleeding.  Given amount of blood loss in last 24 hrs, I am concerned it may be upper.  Indication for Plavix unclear without outside records.  Proceed to EGD today.  The benefits and risks of the planned procedure were described in detail with the patient or (when appropriate) their health care proxy.  Risks were outlined as including, but not limited to, bleeding, infection, perforation, adverse medication reaction leading to cardiac or pulmonary decompensation, or pancreatitis (if ERCP).  The limitation of incomplete mucosal visualization was also discussed.  No guarantees or warranties were given. Patient at increased risk for cardiopulmonary complications of procedure due to medical comorbidities.   If negative, colonoscopy tomorrow.    Serial hgb and Hct, transfuse  to keep Hgb > 7.5  Charlie PitterHenry L Danis III Pager [phone]  Mon-Fri 8a-5p [phone] after 5p, weekends, holidays

## 2017-10-07 NOTE — Transfer of Care (Signed)
Immediate Anesthesia Transfer of Care Note  Patient: Steven Dodson  Procedure(s) Performed: ESOPHAGOGASTRODUODENOSCOPY (EGD) (N/A )  Patient Location: Endoscopy Unit  Anesthesia Type:MAC  Level of Consciousness: drowsy  Airway & Oxygen Therapy: Patient Spontanous Breathing and Patient connected to nasal cannula oxygen  Post-op Assessment: Report given to RN and Post -op Vital signs reviewed and stable  Post vital signs: Reviewed and stable  Last Vitals:  Vitals:   10/07/17 0921 10/07/17 1031  BP: (!) 142/64 (!) 182/71  Pulse: 85   Resp: 18   Temp: 37 C 37 C  SpO2: 99% 95%    Last Pain:  Vitals:   10/07/17 1031  TempSrc: Oral         Complications: No apparent anesthesia complications

## 2017-10-07 NOTE — Progress Notes (Signed)
Progress Note   Steven Dodson ZOX:[id number] DOB: 25-Nov-1931 DOA: 10/06/2017 PCP: [name], MD   LOS: 1 day    Brief Narrative:  Steven Dodson is a 84 year old male with a medical history significant for hypertension, GERD, glaucoma, arthritis, gout, and hyperlipidemia who presented to the [facility] ED on 10/06/2017 with multiple episodes of diarrhea, complicated by rectal bleeding. Yesterday patient had about 5 episodes of "loose stools" where the patient had one episode of "pink blood" on the toilet paper after wiping. He denied blood in the toilet or in his stool, but did admit he didn't look much. He has a history of taking ASA, but was switched to Plavix about 5 years ago per his PCP for vascular disease. He takes Aleve daily for chronic left leg pain. Patient's last colonoscopy, in 2000, demonstrated hemorrhoids and diverticulosis. He has a history of thrombosed hemorrhoids years ago where he experienced large amounts of red blood in his stools. He has had 3 separate past hernia repairs. Upon arrival to [facility] ED, patient had a hemoglobin of 12.9 and WBC of 14.3, PT/INR normal, BUN elevated at 25 with normal creatinine. Initial physical exam demonstrated a benign abdomen. Patient was not tachycardic or hypotensive. He was transferred to [facility] due to GI coverage.  Patient was admitted on the working diagnosis of acute GI bleed with Plavix use being a risk factor for complications. GI consulted.  Assessment/Plan:   Principal Problem:   Acute GI bleeding Active Problems:   Acute blood loss anemia   Essential hypertension   Gout   HLD (hyperlipidemia)   1. Acute GI Bleed: Patient's last BM, this morning, was maroon in color and more formed than yesterday. He currently denies any abdominal pain. GI was consulted -His hemoglobin has dropped from 12.9 at [facility] ED to 9.1 today at [facility]. Continue to monitor hemoglobin levels with CBC. Today, BUN is 33 with creatinine  still normal. Order CMP tomorrow am to monitor renal function, especially after CTA.  -Patient underwent an EGD on 10/07/17 which showed no abnormalities/ signs of bleeding. Patient did have a mild hiatal hernia. -CTA of abdomen and pelvis will be performed today to check for signs/site of bleeding -If CTA comes back negative, patient will undergo a colonoscopy tomorrow -Patient switched from NPO to clear liquid diet.  2. Anemia due to acute blood loss: Patient's hemoglobin has dropped from 12.9 at [facility] ED to 9.1 over the past 24 hours. -Continue to monitor hemoglobin levels with CBC -see above for plans to identify site of bleeding -transfuse if patient's hemoglobin falls below 7  3. Essential Hypertension: Patient's BP has been fluctuating. Most of his BP readings are SBP 140s-180s. BP taken right before his EDG demonstrated hypotension of 73/23.  -Outpatient HTN medications include Labetalol, diuretic, and ACEI. Hold the diuretic and ACEI. -Continue Labetalol. Hydralazine prn q 4 hours if systolic blood pressure is greater than 160. -Continue to monitor closely. If patient is continuously hypotensive, may need to be moved to step-down unit for closer observation  4. History of Gout: Continue allopurinol, but hold Indomethacin.  5. Hyperlipidemia: Patient is on statin outpatient. Hold statin until GI bleed controlled.      Family Communication/Anticipated D/C date and plan/Code Status   DVT prophylaxis: SCDs Code Status: Full Family Communication: Patient's son and pastor at bedside Disposition Plan: Home   Medical Consultants:   GI    Antimicrobials:   None   Subjective:  Patient was awake  and alert. Patient's most recent bowel movement was more formed and maroon in color. Patient denies any episodes of diarrhea today. He denies abdominal pain, nausea, vomiting, and pain with bowel movements.    Objective:   Vitals:   10/07/17 0447 10/07/17 0921 10/07/17 1031  10/07/17 1125  BP: (!) 163/63 (!) 142/64 (!) 182/71 (!) 73/23  Pulse: 94 85    Resp: 16 18  18   Temp: 98.5 F (36.9 C) 98.6 F (37 C) 98.6 F (37 C) 97.7 F (36.5 C)  TempSrc:  Oral Oral Oral  SpO2: 97% 99% 95% 94%  Weight:   83 kg (183 lb)   Height:   5\' 7"  (1.702 m)     Intake/Output Summary (Last 24 hours) at 10/07/2017 1220 Last data filed at 10/07/2017 1119 Gross per 24 hour  Intake 502.5 ml  Output -  Net 502.5 ml   Filed Weights   10/06/17 2115 10/07/17 0412 10/07/17 1031  Weight: 83.1 kg (183 lb 3.2 oz) 83.1 kg (183 lb 3.2 oz) 83 kg (183 lb)     Physical Exam:   Constitutional: NAD Respiratory: clear to auscultation bilaterally, no wheezing, no crackles. Normal respiratory effort. No accessory muscle use.  Cardiovascular: Regular rate and rhythm, normal S1-S2, no murmurs / rubs / gallops. No LE edema. 2+ pedal pulses.  Abdomen: soft and nontender. Mildly distended. Bowel sounds positive.  Musculoskeletal: no clubbing / cyanosis. No joint deformity upper and lower extremities. Normal muscle tone.  Skin: no rashes, lesions, ulcers. No induration Psychiatric: Alert and oriented x 3    Data Reviewed:   I have independently reviewed following labs and imaging studies:   CBC: Recent Labs  Lab 10/06/17 2143 10/07/17 0311 10/07/17 0633  WBC 12.9* 14.1* 12.4*  NEUTROABS 11.2*  --   --   HGB 10.5* 9.8* 9.1*  HCT 31.2* 28.8* 26.0*  MCV 93.7 93.2 94.2  PLT 181 191 170   Basic Metabolic Panel: Recent Labs  Lab 10/06/17 2143 10/07/17 0311  NA 135 139  K 4.2 4.0  CL 107 110  CO2 20* 19*  GLUCOSE 140* 144*  BUN 30* 33*  CREATININE 1.06 1.04  CALCIUM 8.1* 8.4*   GFR: Estimated Creatinine Clearance: 55.5 mL/min (by C-G formula based on SCr of 1.04 mg/dL). Liver Function Tests: Recent Labs  Lab 10/06/17 2143  AST 20  ALT 14*  ALKPHOS 72  BILITOT 0.7  PROT 5.2*  ALBUMIN 3.1*   No results for input(s): LIPASE, AMYLASE in the last 168 hours. No  results for input(s): AMMONIA in the last 168 hours. Coagulation Profile: Recent Labs  Lab 10/06/17 2143  INR 1.11   Cardiac Enzymes: No results for input(s): CKTOTAL, CKMB, CKMBINDEX, TROPONINI in the last 168 hours. BNP (last 3 results) No results for input(s): PROBNP in the last 8760 hours. HbA1C: No results for input(s): HGBA1C in the last 72 hours. CBG: Recent Labs  Lab 10/07/17 0039 10/07/17 0737 10/07/17 1216  GLUCAP 123* 140* 147*   Lipid Profile: No results for input(s): CHOL, HDL, LDLCALC, TRIG, CHOLHDL, LDLDIRECT in the last 72 hours. Thyroid Function Tests: No results for input(s): TSH, T4TOTAL, FREET4, T3FREE, THYROIDAB in the last 72 hours. Anemia Panel: No results for input(s): VITAMINB12, FOLATE, FERRITIN, TIBC, IRON, RETICCTPCT in the last 72 hours. Urine analysis: No results found for: COLORURINE, APPEARANCEUR, LABSPEC, PHURINE, GLUCOSEU, HGBUR, BILIRUBINUR, KETONESUR, PROTEINUR, UROBILINOGEN, NITRITE, LEUKOCYTESUR Sepsis Labs: Invalid input(s): PROCALCITONIN, LACTICIDVEN  No results found for this or any previous visit (  from the past 240 hour(s)).    Radiology Studies: No results found.    Medication:   . allopurinol  300 mg Oral BID  . brimonidine  1 drop Both Eyes BID WC  . dorzolamide-timolol  1 drop Both Eyes BID WC  . labetalol  300 mg Oral BID  . latanoprost  1 drop Both Eyes QHS    Continuous Infusions: . sodium chloride    . dextrose 5 % and 0.9% NaCl 75 mL/hr at 10/07/17 0039      Time spent: 20 minutes  Signed, Caprice Renshaw, PA-S [employer] Email: [email or web address]

## 2017-10-07 NOTE — Anesthesia Postprocedure Evaluation (Signed)
Anesthesia Post Note  Patient: Steven Dodson  Procedure(s) Performed: ESOPHAGOGASTRODUODENOSCOPY (EGD) (N/A )     Patient location during evaluation: PACU Anesthesia Type: MAC Level of consciousness: awake Pain management: pain level controlled Vital Signs Assessment: post-procedure vital signs reviewed and stable Respiratory status: spontaneous breathing Cardiovascular status: stable Anesthetic complications: no    Last Vitals:  Vitals:   10/07/17 1140 10/07/17 1150  BP: (!) 116/46 (!) 137/37  Pulse: 83 79  Resp: 19 20  Temp:    SpO2: 96% 96%    Last Pain:  Vitals:   10/07/17 1125  TempSrc: Oral                 Keslee Harrington

## 2017-10-07 NOTE — Anesthesia Preprocedure Evaluation (Addendum)
Anesthesia Evaluation  Patient identified by MRN, date of birth, ID band Patient awake    Reviewed: Allergy & Precautions, NPO status , Patient's Chart, lab work & pertinent test results  Airway Mallampati: I  TM Distance: >3 FB Neck ROM: Full    Dental  (+) Chipped, Teeth Intact, Dental Advisory Given, Missing   Pulmonary neg pulmonary ROS,    breath sounds clear to auscultation       Cardiovascular hypertension,  Rhythm:Irregular Rate:Normal - Systolic Click    Neuro/Psych    GI/Hepatic negative GI ROS, Neg liver ROS,   Endo/Other  negative endocrine ROS  Renal/GU negative Renal ROS     Musculoskeletal  (+) Arthritis ,   Abdominal   Peds  Hematology  (+) anemia ,   Anesthesia Other Findings   Reproductive/Obstetrics                            Anesthesia Physical Anesthesia Plan  ASA: III  Anesthesia Plan: MAC   Post-op Pain Management:    Induction: Intravenous  PONV Risk Score and Plan: 1 and Treatment may vary due to age or medical condition  Airway Management Planned: Nasal Cannula  Additional Equipment:   Intra-op Plan:   Post-operative Plan:   Informed Consent: I have reviewed the patients History and Physical, chart, labs and discussed the procedure including the risks, benefits and alternatives for the proposed anesthesia with the patient or authorized representative who has indicated his/her understanding and acceptance.   Dental advisory given  Plan Discussed with: CRNA, Anesthesiologist and Surgeon  Anesthesia Plan Comments:        Anesthesia Quick Evaluation

## 2017-10-07 NOTE — Progress Notes (Signed)
CTA report noted. Atherosclerotic findings. No definite LGI bleeding seen.  Colonoscopy tomorrow (with Dr. Lavon PaganiniNandigam) as planned and discussed with patient after EGD this morning. Orders placed. Called patient's room - no answer.

## 2017-10-07 NOTE — Progress Notes (Signed)
Per doctor Patrick, dLuisa Hartue to platelet shortage patient will not be receiving platelet transfusion, Lafe GarinGherge is aware, orders placed for 1 unit or PRBCs.

## 2017-10-07 NOTE — Anesthesia Procedure Notes (Signed)
Procedure Name: MAC Date/Time: 10/07/2017 11:00 AM Performed by: Leonor Liv, CRNA Pre-anesthesia Checklist: Patient identified, Emergency Drugs available, Suction available, Patient being monitored and Timeout performed Oxygen Delivery Method: Simple face mask Placement Confirmation: positive ETCO2

## 2017-10-07 NOTE — H&P (Signed)
History and Physical    Steven Dodson ZOX:[id number] DOB: [birth year] DOA: 10/06/2017  PCP: [name], MD  Patient coming from: Patient was transferred from [facility].  Chief Complaint: Rectal bleeding.  HPI: Steven Dodson is a 84 y.o. male with history of hypertension, GERD, glaucoma, arthritis had experienced one episode of frank rectal bleeding yesterday morning.  Patient states he feels like moving his bowels while he was on the bed and he started moving his bowels even before he got up and it was frankly rectal bleeding.  Denies any abdominal pain nausea vomiting.  He felt mildly dizzy.  Denies any chest pain or shortness of breath.  ED Course: He went to the ER and has had no further episodes.  Hemoglobin in the ER was around 12.9 WBC count is 14.3.  Since patient had frank rectal bleeding and is on Plavix patient admitted for further observation.  Since there was no GI available at [facility] patient was transferred to [facility].  At the time of my exam patient abdomen appears benign.  Patient not tachycardic or hypotensive.  Patient states his last colonoscopy was more than 20 years ago.  I have reviewed patient's labs at [facility] which was available in the patient's chart.  Review of Systems: As per HPI, rest all negative.   Past Medical History:  Diagnosis Date  . Arthritis   . Hypertension     Past Surgical History:  Procedure Laterality Date  . HERNIA REPAIR       reports that  has never smoked. he has never used smokeless tobacco. He reports that he does not drink alcohol or use drugs.  No Known Allergies  Family History  Problem Relation Age of Onset  . Colon cancer Neg Hx     Prior to Admission medications   Medication Sig Start Date End Date Taking? Authorizing Provider  allopurinol (ZYLOPRIM) 300 MG tablet Take 300 mg by mouth 2 (two) times daily.   Yes [provider]  brimonidine (ALPHAGAN) 0.2 % ophthalmic  solution Place 1 drop into both eyes 2 (two) times daily with breakfast and lunch.   Yes [provider]  clopidogrel (PLAVIX) 75 MG tablet Take 75 mg by mouth at bedtime.   Yes [provider]  dorzolamide-timolol (COSOPT) 22.3-6.8 MG/ML ophthalmic solution Place 1 drop into both eyes 2 (two) times daily with breakfast and lunch. With breakfast and lunch   Yes [provider]  labetalol (NORMODYNE) 300 MG tablet Take 300 mg by mouth 2 (two) times daily.   Yes [provider]  latanoprost (XALATAN) 0.005 % ophthalmic solution Place 1 drop into both eyes at bedtime.   Yes [provider]  lisinopril-hydrochlorothiazide (PRINZIDE,ZESTORETIC) 20-25 MG tablet Take 1 tablet by mouth daily.   Yes [provider]  Multiple Vitamin (MULTIVITAMIN WITH MINERALS) TABS tablet Take 1 tablet by mouth daily.   Yes [provider]  naproxen sodium (ALEVE) 220 MG tablet Take 220 mg by mouth daily.   Yes [provider]  pravastatin (PRAVACHOL) 40 MG tablet Take 40 mg by mouth at bedtime.   Yes [provider]  indomethacin (INDOCIN) 50 MG capsule Take 50 mg by mouth 3 (three) times daily as needed (gout flare).    [provider]    Physical Exam: Vitals:   10/06/17 2115  BP: (!) 159/58  Pulse: 91  Resp: 16  Temp: 99.2 F (37.3 C)  SpO2: 99%  Weight: 83.1 kg (  183 lb 3.2 oz)  Height: 5\' 7"  (1.702 m)      Constitutional: Moderately built and nourished. Vitals:   10/06/17 2115  BP: (!) 159/58  Pulse: 91  Resp: 16  Temp: 99.2 F (37.3 C)  SpO2: 99%  Weight: 83.1 kg (183 lb 3.2 oz)  Height: 5\' 7"  (1.702 m)   Eyes: Anicteric no pallor. ENMT: No discharge from the ears eyes nose or mouth. Neck: No mass palpated no neck rigidity.  No JVD appreciated. Respiratory: No rhonchi or crepitations. Cardiovascular: S1-S2 heard no murmurs appreciated. Abdomen: Soft nontender bowel sounds present.  Mildly distended.  No  guarding rigidity. Musculoskeletal: No edema.  No joint effusion. Skin: No rash.  Skin appears warm. Neurologic: Alert awake oriented to time place and person.  Moves all extremities. Psychiatric: Appears normal.  Normal affect.   Labs on Admission: I have personally reviewed following labs and imaging studies  CBC: Recent Labs  Lab 10/06/17 2143  WBC 12.9*  NEUTROABS 11.2*  HGB 10.5*  HCT 31.2*  MCV 93.7  PLT 181   Basic Metabolic Panel: Recent Labs  Lab 10/06/17 2143  NA 135  K 4.2  CL 107  CO2 20*  GLUCOSE 140*  BUN 30*  CREATININE 1.06  CALCIUM 8.1*   GFR: Estimated Creatinine Clearance: 54.4 mL/min (by C-G formula based on SCr of 1.06 mg/dL). Liver Function Tests: Recent Labs  Lab 10/06/17 2143  AST 20  ALT 14*  ALKPHOS 72  BILITOT 0.7  PROT 5.2*  ALBUMIN 3.1*   No results for input(s): LIPASE, AMYLASE in the last 168 hours. No results for input(s): AMMONIA in the last 168 hours. Coagulation Profile: Recent Labs  Lab 10/06/17 2143  INR 1.11   Cardiac Enzymes: No results for input(s): CKTOTAL, CKMB, CKMBINDEX, TROPONINI in the last 168 hours. BNP (last 3 results) No results for input(s): PROBNP in the last 8760 hours. HbA1C: No results for input(s): HGBA1C in the last 72 hours. CBG: No results for input(s): GLUCAP in the last 168 hours. Lipid Profile: No results for input(s): CHOL, HDL, LDLCALC, TRIG, CHOLHDL, LDLDIRECT in the last 72 hours. Thyroid Function Tests: No results for input(s): TSH, T4TOTAL, FREET4, T3FREE, THYROIDAB in the last 72 hours. Anemia Panel: No results for input(s): VITAMINB12, FOLATE, FERRITIN, TIBC, IRON, RETICCTPCT in the last 72 hours. Urine analysis: No results found for: COLORURINE, APPEARANCEUR, LABSPEC, PHURINE, GLUCOSEU, HGBUR, BILIRUBINUR, KETONESUR, PROTEINUR, UROBILINOGEN, NITRITE, LEUKOCYTESUR Sepsis Labs: @LABRCNTIP (procalcitonin:4,lacticidven:4) )No results found for this or any previous visit (from the  past 240 hour(s)).   Radiological Exams on Admission: No results found.   Assessment/Plan Principal Problem:   Acute GI bleeding Active Problems:   Acute blood loss anemia   Essential hypertension   Gout   HLD (hyperlipidemia)    1. Acute GI bleeding -likely lower GI bleed since patient has frank rectal bleeding.  Patient states during his last colonoscopy patient was told that he had hemorrhoids and diverticulosis.  At this time will keep patient n.p.o. except medications and ice chips.  Closely follow CBC.  Patient's hemoglobin has dropped by 2 [facility].  Will consult GI in a.m. sooner if patient has further bleeds or becomes hypotensive.  Hold Plavix.  Prior to transfer ER physician had discussed with Willette ClusterPaula Guenther GI [facility].  Please consult GI in a.m. 2. Acute blood loss anemia probably from GI bleed closely follow CBC.  Transfuse if patient becomes hypotensive or hemoglobin falls less  than 7. 3. Hypertension on labetalol diuretics and ACE inhibitor.  We will hold ACE inhibitor and diuretics and keep patient on labetalol keep patient on PRN IV hydralazine.  Closely follow blood pressure trends. 4. History of gout on allopurinol.  Hold indomethacin. 5. Hyperlipidemia on statins which will be started after GI workup.  Patient states he takes Plavix after he had an incident where he had difficulty walking.  He is to be on aspirin prior to that.   DVT prophylaxis: SCDs. Code Status: Full code. Family Communication: Patient's son. Disposition Plan: Home. Consults called: None. Admission status: Inpatient.   Eduard Clos MD [facility] Pager [phone].  If 7PM-7AM, please contact night-coverage [email or web address] Password [facility]  10/07/2017, 12:10 AM

## 2017-10-07 NOTE — Progress Notes (Signed)
Pt had a bowel movement, stool maroon in color. Continuing to monitor.

## 2017-10-08 ENCOUNTER — Encounter (HOSPITAL_COMMUNITY): Payer: Self-pay | Admitting: *Deleted

## 2017-10-08 ENCOUNTER — Encounter (HOSPITAL_COMMUNITY)
Admission: AD | Disposition: A | Discharge status: Home / Self Care | Payer: Self-pay | Source: Other Acute Inpatient Hospital | Attending: Internal Medicine

## 2017-10-08 DIAGNOSIS — K648 Other hemorrhoids: Secondary | ICD-10-CM

## 2017-10-08 DIAGNOSIS — K5731 Diverticulosis of large intestine without perforation or abscess with bleeding: Secondary | ICD-10-CM

## 2017-10-08 DIAGNOSIS — K921 Melena: Secondary | ICD-10-CM

## 2017-10-08 HISTORY — PX: COLONOSCOPY: SHX5424

## 2017-10-08 LAB — BPAM PLATELET PHERESIS
Blood Product Expiration Date: 201902222359
ISSUE DATE / TIME: 201902221321
UNIT TYPE AND RH: 6200

## 2017-10-08 LAB — TYPE AND SCREEN
ABO/RH(D): AB POS
Antibody Screen: NEGATIVE
Unit division: 0

## 2017-10-08 LAB — BASIC METABOLIC PANEL
Anion gap: 9 (ref 5–15)
BUN: 27 mg/dL — AB (ref 6–20)
CHLORIDE: 114 mmol/L — AB (ref 101–111)
CO2: 18 mmol/L — ABNORMAL LOW (ref 22–32)
CREATININE: 0.85 mg/dL (ref 0.61–1.24)
Calcium: 8.3 mg/dL — ABNORMAL LOW (ref 8.9–10.3)
GFR calc Af Amer: >60 mL/min (ref >60)
GFR calc non Af Amer: >60 mL/min (ref >60)
Glucose, Bld: 127 mg/dL — ABNORMAL HIGH (ref 65–99)
Potassium: 3.9 mmol/L (ref 3.5–5.1)
SODIUM: 141 mmol/L (ref 135–145)

## 2017-10-08 LAB — GLUCOSE, CAPILLARY
Glucose-Capillary: 107 mg/dL — ABNORMAL HIGH (ref 65–99)
Glucose-Capillary: 115 mg/dL — ABNORMAL HIGH (ref 65–99)
Glucose-Capillary: 117 mg/dL — ABNORMAL HIGH (ref 65–99)
Glucose-Capillary: 131 mg/dL — ABNORMAL HIGH (ref 65–99)
Glucose-Capillary: 89 mg/dL (ref 65–99)

## 2017-10-08 LAB — CBC
HCT: 28.8 % — ABNORMAL LOW (ref 39.0–52.0)
Hemoglobin: 9.7 g/dL — ABNORMAL LOW (ref 13.0–17.0)
MCH: 31.9 pg (ref 26.0–34.0)
MCHC: 33.7 g/dL (ref 30.0–36.0)
MCV: 94.7 fL (ref 78.0–100.0)
PLATELETS: 149 10*3/uL — AB (ref 150–400)
RBC: 3.04 MIL/uL — ABNORMAL LOW (ref 4.22–5.81)
RDW: 13.7 % (ref 11.5–15.5)
WBC: 11.2 10*3/uL — ABNORMAL HIGH (ref 4.0–10.5)

## 2017-10-08 LAB — PREPARE PLATELET PHERESIS: Unit division: 0

## 2017-10-08 LAB — BPAM RBC
Blood Product Expiration Date: 201903132359
ISSUE DATE / TIME: 201902221850
Unit Type and Rh: 8400

## 2017-10-08 SURGERY — COLONOSCOPY
Anesthesia: Moderate Sedation

## 2017-10-08 MED ORDER — FENTANYL CITRATE (PF) 100 MCG/2ML IJ SOLN
INTRAMUSCULAR | Status: DC | PRN
  Administered 2017-10-08 (×2): 25 ug via INTRAVENOUS
  Administered 2017-10-08: 12.5 ug via INTRAVENOUS

## 2017-10-08 MED ORDER — MIDAZOLAM HCL 5 MG/5ML IJ SOLN
INTRAMUSCULAR | Status: DC | PRN
  Administered 2017-10-08 (×4): 1 mg via INTRAVENOUS

## 2017-10-08 MED ORDER — DIPHENHYDRAMINE HCL 50 MG/ML IJ SOLN
INTRAMUSCULAR | Status: AC
  Filled 2017-10-08: qty 1

## 2017-10-08 MED ORDER — MIDAZOLAM HCL 5 MG/ML IJ SOLN
INTRAMUSCULAR | Status: AC
  Filled 2017-10-08: qty 3

## 2017-10-08 MED ORDER — FENTANYL CITRATE (PF) 100 MCG/2ML IJ SOLN
INTRAMUSCULAR | Status: AC
  Filled 2017-10-08: qty 4

## 2017-10-08 MED ORDER — SPOT INK MARKER SYRINGE KIT
PACK | SUBMUCOSAL | Status: AC
  Filled 2017-10-08: qty 5

## 2017-10-08 NOTE — Progress Notes (Signed)
PROGRESS NOTE  Steven Dodson VWU:[id number] DOB: 08/09/1932 DOA: 10/06/2017 PCP: [name], MD   LOS: 2 days   Brief Narrative / Interim history: 84 year old male with history of hypertension, hyperlipidemia, ?vascular disease with perhaps a TIA in the past and at that time he was placed on Plavix, presented to the [facility] emergency room with bleeding in his stool and multiple episodes of diarrhea.  No GI coverage was present there and he was transferred to [facility] for evaluation by gastroenterology.  Assessment & Plan: Principal Problem:   Acute GI bleeding Active Problems:   Acute blood loss anemia   Essential hypertension   Gout   HLD (hyperlipidemia)   Hematochezia   Diverticular hemorrhage   GI bleed with acute blood loss anemia -Patient admitted to the hospital with a GI bleed.  He underwent an upper endoscopy on 2/22 which was without findings to explain his hemorrhage.  On 2/23 underwent a colonoscopy which showed a possible diverticular source for bleeding which was clipped -Advance diet per GI, continue to monitor hemoglobin for further bleeding -Hemoglobin this morning stable at 9.7, recheck in the morning  Hypertension -blood pressure has now normalized, continue labetalol  Acute blood loss anemia -Monitor CBC as above, stable today  Hyperlipidemia -On statin  History of gout -Hold NSAIDs  DVT prophylaxis: SCDs Code Status: Full code Family Communication: no family at bedside Disposition Plan: home when ready   Consultants:   GI  Procedures:   EGD Findings:      The larynx was normal.      A medium-sized hiatal hernia was present.      One moderate benign-appearing, intrinsic stenosis was found at the       gastroesophageal junction. This measured 1.2 cm (inner diameter) and was       traversed.      The stomach was normal.      The cardia and gastric fundus were normal on retroflexion.      A diverticulum was found in the  area of the papilla. Impression:               - Normal larynx.                           - Medium-sized hiatal hernia.                           - Benign-appearing esophageal stenosis.                           - Normal stomach.                           - Duodenal diverticulum.                           - No specimens collected.     Colonoscopy - Severe diverticulosis in the sigmoid colon and in                            the descending colon. There was narrowing of the                            colon in association with the  diverticular opening.                            There was evidence of diverticular spasm.                            Peri-diverticular erythema was seen. There was                            evidence of an impacted diverticulum. There was                            evidence of recent bleeding from the diverticular                            opening. Clips (MR conditional) were placed.                           - Non-bleeding internal hemorrhoids.                           - The examination was otherwise normal.                           - No specimens collected.  Antimicrobials:  None    Subjective: - no chest pain, shortness of breath, no abdominal pain, nausea or vomiting.   Objective: Vitals:   10/08/17 1125 10/08/17 1130 10/08/17 1135 10/08/17 1205  BP: (!) 153/51 (!) 141/41 (!) 141/41 (!) 130/47  Pulse: 67 70 70 67  Resp: 18 18 16    Temp:      TempSrc:      SpO2: 100% 100% 99% 97%  Weight:      Height:        Intake/Output Summary (Last 24 hours) at 10/08/2017 1420 Last data filed at 10/08/2017 0600 Gross per 24 hour  Intake 1200 ml  Output 800 ml  Net 400 ml   Filed Weights   10/07/17 1031 10/07/17 2126 10/08/17 0415  Weight: 83 kg (183 lb) 84.6 kg (186 lb 8.2 oz) 84.6 kg (186 lb 8.2 oz)    Examination:  Constitutional: NAD ENMT: Mucous membranes are moist. No oropharyngeal exudates Neck: normal, supple Respiratory: clear to  auscultation bilaterally, no wheezing, no crackles. Normal respiratory effort.  Cardiovascular: Regular rate and rhythm, no murmurs / rubs / gallops. No LE edema.  Abdomen: no tenderness. Bowel sounds positive.  Skin: no rashes Neurologic: non focal    Data Reviewed: I have independently reviewed following labs and imaging studies   CBC: Recent Labs  Lab 10/06/17 2143 10/07/17 0311 10/07/17 0633 10/07/17 1359 10/07/17 1829 10/08/17 0117  WBC 12.9* 14.1* 12.4* 13.0* 11.8* 11.2*  NEUTROABS 11.2*  --   --   --   --   --   HGB 10.5* 9.8* 9.1* 8.5* 8.6* 9.7*  HCT 31.2* 28.8* 26.0* 25.2* 26.0* 28.8*  MCV 93.7 93.2 94.2 94.4 95.2 94.7  PLT 181 191 170 172 170 149*   Basic Metabolic Panel: Recent Labs  Lab 10/06/17 2143 10/07/17 0311 10/08/17 0117  NA 135 139 141  K 4.2 4.0 3.9  CL 107 110 114*  CO2 20* 19* 18*  GLUCOSE 140*  144* 127*  BUN 30* 33* 27*  CREATININE 1.06 1.04 0.85  CALCIUM 8.1* 8.4* 8.3*   GFR: Estimated Creatinine Clearance: 68.5 mL/min (by C-G formula based on SCr of 0.85 mg/dL). Liver Function Tests: Recent Labs  Lab 10/06/17 2143  AST 20  ALT 14*  ALKPHOS 72  BILITOT 0.7  PROT 5.2*  ALBUMIN 3.1*   No results for input(s): LIPASE, AMYLASE in the last 168 hours. No results for input(s): AMMONIA in the last 168 hours. Coagulation Profile: Recent Labs  Lab 10/06/17 2143  INR 1.11   Cardiac Enzymes: No results for input(s): CKTOTAL, CKMB, CKMBINDEX, TROPONINI in the last 168 hours. BNP (last 3 results) No results for input(s): PROBNP in the last 8760 hours. HbA1C: No results for input(s): HGBA1C in the last 72 hours. CBG: Recent Labs  Lab 10/07/17 1702 10/07/17 2130 10/08/17 0018 10/08/17 0823 10/08/17 1204  GLUCAP 142* 129* 115* 107* 117*   Lipid Profile: No results for input(s): CHOL, HDL, LDLCALC, TRIG, CHOLHDL, LDLDIRECT in the last 72 hours. Thyroid Function Tests: No results for input(s): TSH, T4TOTAL, FREET4, T3FREE,  THYROIDAB in the last 72 hours. Anemia Panel: No results for input(s): VITAMINB12, FOLATE, FERRITIN, TIBC, IRON, RETICCTPCT in the last 72 hours. Urine analysis: No results found for: COLORURINE, APPEARANCEUR, LABSPEC, PHURINE, GLUCOSEU, HGBUR, BILIRUBINUR, KETONESUR, PROTEINUR, UROBILINOGEN, NITRITE, LEUKOCYTESUR Sepsis Labs: Invalid input(s): PROCALCITONIN, LACTICIDVEN  No results found for this or any previous visit (from the past 240 hour(s)).    Radiology Studies: Ct Angio Abd/pel W/ And/or W/o  Result Date: 10/07/2017 CLINICAL DATA:  GI bleeding, suspected lower source EXAM: CTA ABDOMEN AND PELVIS WITH CONTRAST TECHNIQUE: Multidetector CT imaging of the abdomen and pelvis was performed using the standard protocol during bolus administration of intravenous contrast. Multiplanar reconstructed images and MIPs were obtained and reviewed to evaluate the vascular anatomy. No precontrast imaging. CONTRAST:  ISOVUE-370 IOPAMIDOL (ISOVUE-370) INJECTION 76% COMPARISON:  None. FINDINGS: VASCULAR Moderate coronary calcifications. Aorta: Moderate calcified atheromatous plaque. Fusiform 2.9 cm infrarenal segment associated with a posterior penetrating atheromatous ulcer, with some eccentric nonocclusive mural thrombus. Smaller penetrating atheromatous ulcer along the left lateral wall of the distal abdominal aorta. No associated intramural hematoma. No dissection. No significant stenosis. Celiac: Patent without evidence of aneurysm, dissection, vasculitis or significant stenosis. SMA: Patent without evidence of aneurysm, dissection, vasculitis or significant stenosis. Renals: Duplicated bilaterally, superior moieties dominant. Bilateral partially calcified ostial plaque, no high-grade stenosis. IMA: Patent without evidence of aneurysm, dissection, vasculitis or significant stenosis. Inflow: Calcified nonocclusive plaque in bilateral common iliac arteries. No aneurysm or dissection. Proximal Outflow:  Minimal plaque. No aneurysm, dissection, or stenosis. Veins: Patent hepatic veins, portal vein, SMV, splenic vein, bilateral renal veins, IVC. The iliac venous system is unremarkable, incompletely distended. Review of the MIP images confirms the above findings. NON-VASCULAR Lower chest: No acute abnormality. Hepatobiliary: Partially calcified subcentimeter stones in the dependent aspect of the nondilated gallbladder. No focal liver lesion or biliary ductal dilatation. Pancreas: Moderate diffuse atrophy without mass or ductal dilatation. Spleen: Normal in size without focal abnormality. Adrenals/Urinary Tract: Adrenal glands are unremarkable. Kidneys are normal, without renal calculi, focal lesion, or hydronephrosis. Bladder is distended. Stomach/Bowel: Small hiatal hernia. Stomach is decompressed. Duodenal diverticulum. Small bowel decompressed. Normal appendix. Colon is nondilated. Multiple distal descending and sigmoid diverticula. Subtle increased density in the lumen of the proximal sigmoid colon best appreciated on the coronal reconstructions, although no definite arterial extravasation is noted on early phase imaging. No evidence of vascular lesion.  Lymphatic: No abdominal or pelvic adenopathy. Reproductive: Marked prostatic enlargement. Other: No ascites.  No free air. Musculoskeletal: Degenerative disc disease L5-S1. Negative for fracture or worrisome bone abnormality. IMPRESSION: VASCULAR 1. Subtle increasing intraluminal density in the proximal sigmoid colon suggestive of source of GI bleed, although no definite arterial extravasation is noted on serial phase imaging. 2. Penetrating atheromatous ulcers in the distal abdominal aorta, ectatic and at risk for aneurysm development. Recommend followup by ultrasound in 5 years. This recommendation follows ACR consensus guidelines: White Paper of the ACR Incidental Findings Committee II on Vascular Findings. J Am Coll Radiol 2013; 10:789-794. NON-VASCULAR 1.  Marked prostatic enlargement with distention of the urinary bladder. 2. Cholelithiasis. 3. Small hiatal hernia. Electronically Signed   By: Corlis Leak M.D.   On: 10/07/2017 15:42     Scheduled Meds: . allopurinol  300 mg Oral BID  . brimonidine  1 drop Both Eyes BID WC  . dorzolamide-timolol  1 drop Both Eyes BID WC  . labetalol  300 mg Oral BID  . latanoprost  1 drop Both Eyes QHS   Continuous Infusions: . sodium chloride    . sodium chloride      Pamella Pert, MD, PhD [facility] Pager [phone]  If 7PM-7AM, please contact night-coverage [email or web address] Password [email or web address] 10/08/2017, 2:20 PM

## 2017-10-08 NOTE — H&P (Signed)
[facility] History and Physical   Primary Care Physician:  [name], MD   Reason for Procedure:  Hematochezia Plan:    Colonoscopy    HPI: Steven Dodson is a 84 y.o. male with CAD, PVD on Aspirin and Plavix admitted with hematochezia. EGD negative for source of acute GI bleed. Last Plavix dose on Feb 20th.    Past Medical History:  Diagnosis Date  . Arthritis   . Glaucoma   . Hypertension     Past Surgical History:  Procedure Laterality Date  . HERNIA REPAIR      Prior to Admission medications   Medication Sig Start Date End Date Taking? Authorizing Provider  allopurinol (ZYLOPRIM) 300 MG tablet Take 300 mg by mouth 2 (two) times daily.   Yes [provider]  brimonidine (ALPHAGAN) 0.2 % ophthalmic solution Place 1 drop into both eyes 2 (two) times daily with breakfast and lunch.   Yes [provider]  clopidogrel (PLAVIX) 75 MG tablet Take 75 mg by mouth at bedtime.   Yes [provider]  dorzolamide-timolol (COSOPT) 22.3-6.8 MG/ML ophthalmic solution Place 1 drop into both eyes 2 (two) times daily with breakfast and lunch. With breakfast and lunch   Yes [provider]  labetalol (NORMODYNE) 300 MG tablet Take 300 mg by mouth 2 (two) times daily.   Yes [provider]  latanoprost (XALATAN) 0.005 % ophthalmic solution Place 1 drop into both eyes at bedtime.   Yes [provider]  lisinopril-hydrochlorothiazide (PRINZIDE,ZESTORETIC) 20-25 MG tablet Take 1 tablet by mouth daily.   Yes [provider]  Multiple Vitamin (MULTIVITAMIN WITH MINERALS) TABS tablet Take 1 tablet by mouth daily.   Yes [provider]  naproxen sodium (ALEVE) 220 MG tablet Take 220 mg by mouth daily.   Yes [provider]  pravastatin (PRAVACHOL) 40 MG tablet Take 40 mg by mouth at bedtime.   Yes [provider]  indomethacin (INDOCIN) 50 MG capsule Take 50 mg by mouth 3 (three) times daily as  needed (gout flare).    [provider]    Current Facility-Administered Medications  Medication Dose Route Frequency Provider Last Rate Last Dose  . [MAR Hold] 0.9 %  sodium chloride infusion   Intravenous Once Gherghe, Costin M, MD      . 0.9 %  sodium chloride infusion   Intravenous Continuous Danis, Starr Lake III, MD      . Mitzi Hansen Hold] 0.9 %  sodium chloride infusion   Intravenous Once Leatha Gilding, MD      . Mitzi Hansen Hold] acetaminophen (TYLENOL) tablet 650 mg  650 mg Oral Q6H PRN Eduard Clos, MD       Or  . Mitzi Hansen Hold] acetaminophen (TYLENOL) suppository 650 mg  650 mg Rectal Q6H PRN Eduard Clos, MD      . Mitzi Hansen Hold] allopurinol (ZYLOPRIM) tablet 300 mg  300 mg Oral BID Eduard Clos, MD   300 mg at 10/08/17 0847  . [MAR Hold] brimonidine (ALPHAGAN) 0.2 % ophthalmic solution 1 drop  1 drop Both Eyes BID WC Eduard Clos, MD   1 drop at 10/08/17 0847  . [MAR Hold] dorzolamide-timolol (COSOPT) 22.3-6.8 MG/ML ophthalmic solution 1 drop  1 drop Both Eyes BID WC Eduard Clos, MD   1 drop at 10/08/17 0847  . fentaNYL (SUBLIMAZE) injection    PRN Napoleon Form, MD   25 mcg at 10/08/17 1048  . [MAR Hold] hydrALAZINE (APRESOLINE)  injection 10 mg  10 mg Intravenous Q4H PRN Eduard ClosKakrakandy, Arshad N, MD      . Mitzi Hansen[MAR Hold] labetalol (NORMODYNE) tablet 300 mg  300 mg Oral BID Leatha GildingGherghe, Costin M, MD   300 mg at 10/08/17 0848  . [MAR Hold] latanoprost (XALATAN) 0.005 % ophthalmic solution 1 drop  1 drop Both Eyes QHS Eduard ClosKakrakandy, Arshad N, MD   1 drop at 10/07/17 2214  . midazolam (VERSED) 5 MG/5ML injection    PRN Napoleon FormNandigam, Gilman Olazabal V, MD   1 mg at 10/08/17 1048  . [MAR Hold] ondansetron (ZOFRAN) tablet 4 mg  4 mg Oral Q6H PRN Eduard ClosKakrakandy, Arshad N, MD       Or  . Mitzi Hansen[MAR Hold] ondansetron [facility](ZOFRAN) injection 4 mg  4 mg Intravenous Q6H PRN Eduard ClosKakrakandy, Arshad N, MD        Allergies as of 10/06/2017  . (No Known Allergies)    Family History  Problem Relation  Age of Onset  . Colon cancer Neg Hx     Social History   Socioeconomic History  . Marital status: Married    Spouse name: Not on file  . Number of children: Not on file  . Years of education: Not on file  . Highest education level: Not on file  Social Needs  . Financial resource strain: Not on file  . Food insecurity - worry: Not on file  . Food insecurity - inability: Not on file  . Transportation needs - medical: Not on file  . Transportation needs - non-medical: Not on file  Occupational History  . Not on file  Tobacco Use  . Smoking status: Never Smoker  . Smokeless tobacco: Never Used  Substance and Sexual Activity  . Alcohol use: No    Frequency: Never  . Drug use: No  . Sexual activity: Not on file  Other Topics Concern  . Not on file  Social History Narrative  . Not on file    Review of Systems:  All other review of systems negative except as mentioned in the HPI.  Physical Exam: Vital signs in last 24 hours: Temp:  [97.7 F (36.5 C)-98.8 F (37.1 C)] 98.3 F (36.8 C) (02/23 1042) Pulse Rate:  [75-83] 80 (02/23 1045) Resp:  [16-21] 21 (02/23 1045) BP: (73-161)/(23-58) 144/58 (02/23 1045) SpO2:  [94 %-100 %] 98 % (02/23 1045) Weight:  [186 lb 8.2 oz (84.6 kg)] 186 lb 8.2 oz (84.6 kg) (02/23 0415) Last BM Date: 10/08/17 General:   Alert,  Well-developed, well-nourished, pleasant and cooperative in NAD Lungs:  Clear throughout to auscultation.   Heart:  Regular rate and rhythm; no murmurs, clicks, rubs,  or gallops. Abdomen:  Soft, nontender and nondistended. Normal bowel sounds.   Neuro/Psych:  Alert and cooperative. Normal mood and affect. A and O x 3   @K .Scherry RanVeena Christon Parada, MD [phone] Mon-Fri 8a-5p [phone] after 5p, weekends, holidays 10/08/2017 10:49 AM@

## 2017-10-08 NOTE — Op Note (Addendum)
[facility] Patient Name: Steven Dodson Procedure Date : 10/08/2017 MRN: [id number] Attending MD: Napoleon Form , MD Date of Birth: 1931-12-22 CSN: [id number] Age: 84 Admit Type: Inpatient Procedure:                Colonoscopy Indications:              Evaluation of unexplained GI bleeding, Hematochezia Providers:                Napoleon Form, MD, Harold Barban, RN, Arlee Muslim Tech., Technician, Verita Schneiders,                            Technician Referring MD:              Medicines:                Fentanyl 62.5 micrograms IV, Midazolam 5 mg IV Complications:            No immediate complications. Estimated Blood Loss:     Estimated blood loss was minimal. Procedure:                Pre-Anesthesia Assessment:                           - Prior to the procedure, a History and Physical                            was performed, and patient medications and                            allergies were reviewed. The patient's tolerance of                            previous anesthesia was also reviewed. The risks                            and benefits of the procedure and the sedation                            options and risks were discussed with the patient.                            All questions were answered, and informed consent                            was obtained. Prior Anticoagulants: The patient has                            taken no previous anticoagulant or antiplatelet                            agents. ASA Grade Assessment: III - A patient with  severe systemic disease. After reviewing the risks                            and benefits, the patient was deemed in                            satisfactory condition to undergo the procedure.                           After obtaining informed consent, the colonoscope                            was passed under direct vision. Throughout the                      procedure, the patient's blood pressure, pulse, and                            oxygen saturations were monitored continuously. The                            EC-[id number] ([id number]) scope was introduced through                            the anus and advanced to the the cecum, identified                            by appendiceal orifice and ileocecal valve. The                            Colonoscope was introduced through the anus and                            advanced to the the cecum, identified by                            appendiceal orifice and ileocecal valve. The                            colonoscopy was performed without difficulty. The                            patient tolerated the procedure well. The quality                            of the bowel preparation was fair. The ileocecal                            valve, appendiceal orifice, and rectum were                            photographed. Scope In: 10:57:02 AM Scope Out: 11:28:26 AM Scope Withdrawal Time: 0 hours 17 minutes 28 seconds  Total Procedure Duration: 0 hours 31 minutes 24 seconds  Findings:  The perianal and digital rectal examinations were normal.      Multiple small and large-mouthed diverticula were found in the sigmoid       colon and descending colon. There was narrowing of the colon in       association with the diverticular opening. There was evidence of       diverticular spasm. Peri-diverticular erythema was seen. There was       evidence of an impacted diverticulum in the sigmoid colon. There was       evidence of recent bleeding from one of the diverticular opening in the       sigmoid colon at 40cm from anal verge with organized clot and small       ulcer. For hemostasis, two hemostatic clips were successfully placed (MR       conditional). There was no bleeding at the end of the procedure.      Non-bleeding internal hemorrhoids were found during retroflexion. The        hemorrhoids were moderate.      The exam was otherwise without abnormality. Impression:               - Preparation of the colon was fair.                           - Severe diverticulosis in the sigmoid colon and in                            the descending colon. There was narrowing of the                            colon in association with the diverticular opening.                            There was evidence of diverticular spasm.                            Peri-diverticular erythema was seen. There was                            evidence of an impacted diverticulum. There was                            evidence of recent bleeding from the diverticular                            opening. Clips (MR conditional) were placed.                           - Non-bleeding internal hemorrhoids.                           - The examination was otherwise normal.                           - No specimens collected. Moderate Sedation:      Moderate (conscious) sedation was administered by the endoscopy nurse       and supervised by the  endoscopist. The following parameters were       monitored: oxygen saturation, heart rate, blood pressure, and response       to care. Total physician intraservice time was 30 minutes. Recommendation:           - Patient has a contact number available for                            emergencies. The signs and symptoms of potential                            delayed complications were discussed with the                            patient. Return to normal activities tomorrow.                            Written discharge instructions were provided to the                            patient.                           - Full liquid diet today, then advance as tolerated                            to mechanical soft diet tomorrow if no further                            bleeding.                           - Continue present medications.                           - No  ibuprofen, naproxen, or other non-steroidal                            anti-inflammatory drugs for 2 weeks.                           - Resume Plavix (clopidogrel) at prior dose in 2                            days, on Monday. Refer to managing physician for                            further adjustment of therapy.                           - No repeat colonoscopy due to age. Procedure Code(s):        --- Professional ---                           [phone], Colonoscopy, flexible; with control of  bleeding, any method                           99153, Moderate sedation services; each additional                            15 minutes intraservice time                           G0500, Moderate sedation services provided by the                            same physician or other qualified health care                            professional performing a gastrointestinal                            endoscopic service that sedation supports,                            requiring the presence of an independent trained                            observer to assist in the monitoring of the                            patient's level of consciousness and physiological                            status; initial 15 minutes of intra-service time;                            patient age 56 years or older (additional time may                            be reported with 40981, as appropriate) Diagnosis Code(s):        --- Professional ---                           K64.8, Other hemorrhoids                           K57.31, Diverticulosis of large intestine without                            perforation or abscess with bleeding                           K92.2, Gastrointestinal hemorrhage, unspecified                           K92.1, Melena (includes Hematochezia) CPT copyright 2016 American Medical Association. All rights reserved. The codes documented in this report are preliminary and upon  coder review may  be revised to meet current compliance requirements. Napoleon Form,  MD 10/08/2017 11:43:07 AM This report has been signed electronically. Number of Addenda: 0

## 2017-10-09 LAB — CBC
HCT: 25 % — ABNORMAL LOW (ref 39.0–52.0)
Hemoglobin: 8.5 g/dL — ABNORMAL LOW (ref 13.0–17.0)
MCH: 32.4 pg (ref 26.0–34.0)
MCHC: 34 g/dL (ref 30.0–36.0)
MCV: 95.4 fL (ref 78.0–100.0)
Platelets: 146 10*3/uL — ABNORMAL LOW (ref 150–400)
RBC: 2.62 MIL/uL — ABNORMAL LOW (ref 4.22–5.81)
RDW: 14.4 % (ref 11.5–15.5)
WBC: 8.7 10*3/uL (ref 4.0–10.5)

## 2017-10-09 LAB — GLUCOSE, CAPILLARY: GLUCOSE-CAPILLARY: 89 mg/dL (ref 65–99)

## 2017-10-09 NOTE — Progress Notes (Signed)
No further sign of bleeding after colonoscopy with clip placement at bleeding site in diverticulum on 2/23.  Hgb drifted down slightly today but with no bleeding this is likely dilutional drop.  He is tolerating diet and can be discharged today.  Should continue on soft diet for a couple of weeks.  Can restart Plavix on 2/25 but should continue to avoid all NSAID's if possible.

## 2017-10-09 NOTE — Progress Notes (Signed)
Patient discharged home with son. AVS printed and read aloud to patient. Patient states he will hold plavix for two days.

## 2017-10-09 NOTE — Discharge Instructions (Signed)
Follow with Steven Dodson, Robert L, MD in 1-2 weeks  Please get a complete blood count and chemistry panel checked by your Primary MD at your next visit, and again as instructed by your Primary MD. Please get your medications reviewed and adjusted by your Primary MD.  Please request your Primary MD to go over all Hospital Tests and Procedure/Radiological results at the follow up, please get all Hospital records sent to your Prim MD by signing hospital release before you go home.  If you had Pneumonia of Lung problems at the Hospital: Please get a 2 view Chest X ray done in 6-8 weeks after hospital discharge or sooner if instructed by your Primary MD.  If you have Congestive Heart Failure: Please call your Cardiologist or Primary MD anytime you have any of the following symptoms:  1) 3 pound weight gain in 24 hours or 5 pounds in 1 week  2) shortness of breath, with or without a dry hacking cough  3) swelling in the hands, feet or stomach  4) if you have to sleep on extra pillows at night in order to breathe  Follow cardiac low salt diet and 1.5 lit/day fluid restriction.  If you have diabetes Accuchecks 4 times/day, Once in AM empty stomach and then before each meal. Log in all results and show them to your primary doctor at your next visit. If any glucose reading is under 80 or above 300 call your primary MD immediately.  If you have Seizure/Convulsions/Epilepsy: Please do not drive, operate heavy machinery, participate in activities at heights or participate in high speed sports until you have seen by Primary MD or a Neurologist and advised to do so again.  If you had Gastrointestinal Bleeding: Please ask your Primary MD to check a complete blood count within one week of discharge or at your next visit. Your endoscopic/colonoscopic biopsies that are pending at the time of discharge, will also need to followed by your Primary MD.  Get Medicines reviewed and adjusted. Please take all your  medications with you for your next visit with your Primary MD  Please request your Primary MD to go over all hospital tests and procedure/radiological results at the follow up, please ask your Primary MD to get all Hospital records sent to his/her office.  If you experience worsening of your admission symptoms, develop shortness of breath, life threatening emergency, suicidal or homicidal thoughts you must seek medical attention immediately by calling 911 or calling your MD immediately  if symptoms less severe.  You must read complete instructions/literature along with all the possible adverse reactions/side effects for all the Medicines you take and that have been prescribed to you. Take any new Medicines after you have completely understood and accpet all the possible adverse reactions/side effects.   Do not drive or operate heavy machinery when taking Pain medications.   Do not take more than prescribed Pain, Sleep and Anxiety Medications  Special Instructions: If you have smoked or chewed Tobacco  in the last 2 yrs please stop smoking, stop any regular Alcohol  and or any Recreational drug use.  Wear Seat belts while driving.  Please note You were cared for by a hospitalist during your hospital stay. If you have any questions about your discharge medications or the care you received while you were in the hospital after you are discharged, you can call the unit and asked to speak with the hospitalist on call if the hospitalist that took care of you is not available.  Once you are discharged, your primary care physician will handle any further medical issues. Please note that NO REFILLS for any discharge medications will be authorized once you are discharged, as it is imperative that you return to your primary care physician (or establish a relationship with a primary care physician if you do not have one) for your aftercare needs so that they can reassess your need for medications and monitor your  lab values.  You can reach the hospitalist office at phone [phone] or fax [phone]   If you do not have a primary care physician, you can call [phone] for a physician referral.  Activity: As tolerated with Full fall precautions use walker/cane & assistance as needed  Diet: regular  Disposition Home

## 2017-10-09 NOTE — Discharge Summary (Signed)
Physician Discharge Summary  Steven Dodson YQI:[id number] DOB: 01/24/1932 DOA: 10/06/2017  PCP: [name], MD  Admit date: 10/06/2017 Discharge date: 10/09/2017  Admitted From: home Disposition:  Home   Recommendations for Outpatient Follow-up:  1. Follow up with PCP in 1-2 weeks  Home Health: none Equipment/Devices: none  Discharge Condition: stable CODE STATUS: Full code Diet recommendation: regular  HPI: Per Dr. Gilford Silvius, Steven Dodson is a 84 y.o. male with history of hypertension, GERD, glaucoma, arthritis had experienced one episode of frank rectal bleeding yesterday morning.  Patient states he feels like moving his bowels while he was on the bed and he started moving his bowels even before he got up and it was frankly rectal bleeding.  Denies any abdominal pain nausea vomiting.  He felt mildly dizzy.  Denies any chest pain or shortness of breath. ED Course: He went to the ER and has had no further episodes.  Hemoglobin in the ER was around 12.9 WBC count is 14.3.  Since patient had frank rectal bleeding and is on Plavix patient admitted for further observation.  Since there was no GI available at [facility] patient was transferred to [facility].  At the time of my exam patient abdomen appears benign.  Patient not tachycardic or hypotensive.  Patient states his last colonoscopy was more than 20 years ago.  I have reviewed patient's labs at [facility] which was available in the patient's chart.  Hospital Course: GI bleed with acute blood loss anemia -Patient admitted to the hospital with a GI bleed.  He underwent an upper endoscopy on 2/22 which was without findings to explain his hemorrhage.  On 2/23 underwent a colonoscopy which showed a possible diverticular source for bleeding which was clipped.  Following his colonoscopy, clinically appears that his bleeding is stopped, his diet was advanced and is able to tolerate a regular diet.  He is back to  baseline, will be discharged home in stable condition, he was advised to hold his Plavix for 2 days. Hypertension -resume home medications Acute blood loss anemia -hemoglobin overall stable, his bleeding has stopped Hyperlipidemia -On statin History of gout     Discharge Diagnoses:  Principal Problem:   Acute GI bleeding Active Problems:   Acute blood loss anemia   Essential hypertension   Gout   HLD (hyperlipidemia)   Hematochezia   Diverticular hemorrhage     Discharge Instructions   Allergies as of 10/09/2017   No Known Allergies     Medication List    TAKE these medications   allopurinol 300 MG tablet Commonly known as:  ZYLOPRIM Take 300 mg by mouth 2 (two) times daily.   brimonidine 0.2 % ophthalmic solution Commonly known as:  ALPHAGAN Place 1 drop into both eyes 2 (two) times daily with breakfast and lunch.   clopidogrel 75 MG tablet Commonly known as:  PLAVIX Take 75 mg by mouth at bedtime.   dorzolamide-timolol 22.3-6.8 MG/ML ophthalmic solution Commonly known as:  COSOPT Place 1 drop into both eyes 2 (two) times daily with breakfast and lunch. With breakfast and lunch   indomethacin 50 MG capsule Commonly known as:  INDOCIN Take 50 mg by mouth 3 (three) times daily as needed (gout flare).   labetalol 300 MG tablet Commonly known as:  NORMODYNE Take 300 mg by mouth 2 (two) times daily.   latanoprost 0.005 % ophthalmic solution Commonly known as:  XALATAN Place 1 drop into both eyes at bedtime.   lisinopril-hydrochlorothiazide 20-25  MG tablet Commonly known as:  PRINZIDE,ZESTORETIC Take 1 tablet by mouth daily.   multivitamin with minerals Tabs tablet Take 1 tablet by mouth daily.   naproxen sodium 220 MG tablet Commonly known as:  ALEVE Take 220 mg by mouth daily.   pravastatin 40 MG tablet Commonly known as:  PRAVACHOL Take 40 mg by mouth at bedtime.      Follow-up Information    Dough, Doris Cheadle, MD. Schedule an appointment as  soon as possible for a visit in 2 week(s).   Specialty:  Family Medicine Contact information: [address] [place] [address] [phone]           Consultations:  Gastroenterology   Procedures/Studies:  EGD Findings: The larynx was normal. A medium-sized hiatal hernia was present. One moderate benign-appearing, intrinsic stenosis was found at the  gastroesophageal junction. This measured 1.2 cm (inner diameter) and was  traversed. The stomach was normal. The cardia and gastric fundus were normal on retroflexion. A diverticulum was found in the area of the papilla. Impression: - Normal larynx. - Medium-sized hiatal hernia. - Benign-appearing esophageal stenosis. - Normal stomach. - Duodenal diverticulum. - No specimens collected.     Colonoscopy - Severe diverticulosis in the sigmoid colon and in  the descending colon. There was narrowing of the  colon in association with the diverticular opening.  There was evidence of diverticular spasm.  Peri-diverticular erythema was seen. There was  evidence of an impacted diverticulum. There was  evidence of recent bleeding from the diverticular  opening. Clips (MR conditional) were placed. - Non-bleeding internal hemorrhoids. - The examination was otherwise normal. - No specimens collected.    Ct Angio Abd/pel W/ And/or W/o  Result Date: 10/07/2017 CLINICAL DATA:  GI bleeding, suspected lower source EXAM: CTA ABDOMEN AND PELVIS WITH CONTRAST TECHNIQUE: Multidetector CT imaging  of the abdomen and pelvis was performed using the standard protocol during bolus administration of intravenous contrast. Multiplanar reconstructed images and MIPs were obtained and reviewed to evaluate the vascular anatomy. No precontrast imaging. CONTRAST:  ISOVUE-370 IOPAMIDOL (ISOVUE-370) INJECTION 76% COMPARISON:  None. FINDINGS: VASCULAR Moderate coronary calcifications. Aorta: Moderate calcified atheromatous plaque. Fusiform 2.9 cm infrarenal segment associated with a posterior penetrating atheromatous ulcer, with some eccentric nonocclusive mural thrombus. Smaller penetrating atheromatous ulcer along the left lateral wall of the distal abdominal aorta. No associated intramural hematoma. No dissection. No significant stenosis. Celiac: Patent without evidence of aneurysm, dissection, vasculitis or significant stenosis. SMA: Patent without evidence of aneurysm, dissection, vasculitis or significant stenosis. Renals: Duplicated bilaterally, superior moieties dominant. Bilateral partially calcified ostial plaque, no high-grade stenosis. IMA: Patent without evidence of aneurysm, dissection, vasculitis or significant stenosis. Inflow: Calcified nonocclusive plaque in bilateral common iliac arteries. No aneurysm or dissection. Proximal Outflow: Minimal plaque. No aneurysm, dissection, or stenosis. Veins: Patent hepatic veins, portal vein, SMV, splenic vein, bilateral renal veins, IVC. The iliac venous system is unremarkable, incompletely distended. Review of the MIP images confirms the above findings. NON-VASCULAR Lower chest: No acute abnormality. Hepatobiliary: Partially calcified subcentimeter stones in the dependent aspect of the nondilated gallbladder. No focal liver lesion or biliary ductal dilatation. Pancreas: Moderate diffuse atrophy without mass or ductal dilatation. Spleen: Normal in size without focal abnormality. Adrenals/Urinary Tract: Adrenal glands are unremarkable. Kidneys are normal, without  renal calculi, focal lesion, or hydronephrosis. Bladder is distended. Stomach/Bowel: Small hiatal hernia. Stomach is decompressed. Duodenal diverticulum. Small bowel decompressed. Normal appendix. Colon is nondilated. Multiple distal descending and sigmoid diverticula. Subtle increased density  in the lumen of the proximal sigmoid colon best appreciated on the coronal reconstructions, although no definite arterial extravasation is noted on early phase imaging. No evidence of vascular lesion. Lymphatic: No abdominal or pelvic adenopathy. Reproductive: Marked prostatic enlargement. Other: No ascites.  No free air. Musculoskeletal: Degenerative disc disease L5-S1. Negative for fracture or worrisome bone abnormality. IMPRESSION: VASCULAR 1. Subtle increasing intraluminal density in the proximal sigmoid colon suggestive of source of GI bleed, although no definite arterial extravasation is noted on serial phase imaging. 2. Penetrating atheromatous ulcers in the distal abdominal aorta, ectatic and at risk for aneurysm development. Recommend followup by ultrasound in 5 years. This recommendation follows ACR consensus guidelines: White Paper of the ACR Incidental Findings Committee II on Vascular Findings. J Am Coll Radiol 2013; 10:789-794. NON-VASCULAR 1. Marked prostatic enlargement with distention of the urinary bladder. 2. Cholelithiasis. 3. Small hiatal hernia. Electronically Signed   By: Corlis Leak  Hassell M.D.   On: 10/07/2017 15:42     Subjective: - no chest pain, shortness of breath, no abdominal pain, nausea or vomiting.   Discharge Exam: Vitals:   10/09/17 0512 10/09/17 0900  BP: (!) 151/47 (!) 160/66  Pulse: 69 76  Resp: 20 18  Temp: 99 F (37.2 C) 98.3 F (36.8 C)  SpO2: 98% 98%    General: Pt is alert, awake, not in acute distress Cardiovascular: RRR, S1/S2 +, no rubs, no gallops Respiratory: CTA bilaterally, no wheezing, no rhonchi Abdominal: Soft, NT, ND, bowel sounds + Extremities: no edema, no  cyanosis    The results of significant diagnostics from this hospitalization (including imaging, microbiology, ancillary and laboratory) are listed below for reference.     Microbiology: No results found for this or any previous visit (from the past 240 hour(s)).   Labs: BNP (last 3 results) No results for input(s): BNP in the last 8760 hours. Basic Metabolic Panel: Recent Labs  Lab 10/06/17 2143 10/07/17 0311 10/08/17 0117  NA 135 139 141  K 4.2 4.0 3.9  CL 107 110 114*  CO2 20* 19* 18*  GLUCOSE 140* 144* 127*  BUN 30* 33* 27*  CREATININE 1.06 1.04 0.85  CALCIUM 8.1* 8.4* 8.3*   Liver Function Tests: Recent Labs  Lab 10/06/17 2143  AST 20  ALT 14*  ALKPHOS 72  BILITOT 0.7  PROT 5.2*  ALBUMIN 3.1*   No results for input(s): LIPASE, AMYLASE in the last 168 hours. No results for input(s): AMMONIA in the last 168 hours. CBC: Recent Labs  Lab 10/06/17 2143  10/07/17 0633 10/07/17 1359 10/07/17 1829 10/08/17 0117 10/09/17 0523  WBC 12.9*   < > 12.4* 13.0* 11.8* 11.2* 8.7  NEUTROABS 11.2*  --   --   --   --   --   --   HGB 10.5*   < > 9.1* 8.5* 8.6* 9.7* 8.5*  HCT 31.2*   < > 26.0* 25.2* 26.0* 28.8* 25.0*  MCV 93.7   < > 94.2 94.4 95.2 94.7 95.4  PLT 181   < > 170 172 170 149* 146*   < > = values in this interval not displayed.   Cardiac Enzymes: No results for input(s): CKTOTAL, CKMB, CKMBINDEX, TROPONINI in the last 168 hours. BNP: Invalid input(s): POCBNP CBG: Recent Labs  Lab 10/08/17 0823 10/08/17 1204 10/08/17 1649 10/08/17 2206 10/09/17 0801  GLUCAP 107* 117* 131* 89 89   D-Dimer No results for input(s): DDIMER in the last 72 hours. Hgb A1c No results for input(s): HGBA1C in  the last 72 hours. Lipid Profile No results for input(s): CHOL, HDL, LDLCALC, TRIG, CHOLHDL, LDLDIRECT in the last 72 hours. Thyroid function studies No results for input(s): TSH, T4TOTAL, T3FREE, THYROIDAB in the last 72 hours.  Invalid input(s): FREET3 Anemia  work up No results for input(s): VITAMINB12, FOLATE, FERRITIN, TIBC, IRON, RETICCTPCT in the last 72 hours. Urinalysis No results found for: COLORURINE, APPEARANCEUR, LABSPEC, PHURINE, GLUCOSEU, HGBUR, BILIRUBINUR, KETONESUR, PROTEINUR, UROBILINOGEN, NITRITE, LEUKOCYTESUR Sepsis Labs Invalid input(s): PROCALCITONIN,  WBC,  LACTICIDVEN   Time coordinating discharge: 35 minutes  SIGNED:  Pamella Pert, MD  [facility] 10/09/2017, 3:38 PM Pager [phone]  If 7PM-7AM, please contact night-coverage [email or web address] Password [email or web address]

## 2017-10-11 DIAGNOSIS — Z8719 Personal history of other diseases of the digestive system: Secondary | ICD-10-CM | POA: Insufficient documentation

## 2017-10-11 HISTORY — DX: Personal history of other diseases of the digestive system: Z87.19

## 2018-05-02 DIAGNOSIS — T783XXA Angioneurotic edema, initial encounter: Secondary | ICD-10-CM

## 2018-05-02 HISTORY — DX: Angioneurotic edema, initial encounter: T78.3XXA

## 2018-06-16 ENCOUNTER — Inpatient Hospital Stay (HOSPITAL_COMMUNITY)
Admission: AD | Admit: 2018-06-16 | Discharge: 2018-06-20 | DRG: 378 | Disposition: A | Discharge status: Home / Self Care | Payer: Medicare HMO | Source: Other Acute Inpatient Hospital | Attending: Internal Medicine | Admitting: Internal Medicine

## 2018-06-16 ENCOUNTER — Other Ambulatory Visit: Payer: Self-pay

## 2018-06-16 ENCOUNTER — Encounter (HOSPITAL_COMMUNITY): Payer: Self-pay | Admitting: *Deleted

## 2018-06-16 DIAGNOSIS — D62 Acute posthemorrhagic anemia: Secondary | ICD-10-CM | POA: Diagnosis present

## 2018-06-16 DIAGNOSIS — I10 Essential (primary) hypertension: Secondary | ICD-10-CM | POA: Diagnosis present

## 2018-06-16 DIAGNOSIS — M1990 Unspecified osteoarthritis, unspecified site: Secondary | ICD-10-CM | POA: Diagnosis present

## 2018-06-16 DIAGNOSIS — Z7902 Long term (current) use of antithrombotics/antiplatelets: Secondary | ICD-10-CM

## 2018-06-16 DIAGNOSIS — M109 Gout, unspecified: Secondary | ICD-10-CM | POA: Diagnosis present

## 2018-06-16 DIAGNOSIS — M1A9XX0 Chronic gout, unspecified, without tophus (tophi): Secondary | ICD-10-CM | POA: Diagnosis not present

## 2018-06-16 DIAGNOSIS — Z79899 Other long term (current) drug therapy: Secondary | ICD-10-CM

## 2018-06-16 DIAGNOSIS — I739 Peripheral vascular disease, unspecified: Secondary | ICD-10-CM | POA: Diagnosis present

## 2018-06-16 DIAGNOSIS — K5731 Diverticulosis of large intestine without perforation or abscess with bleeding: Secondary | ICD-10-CM | POA: Diagnosis not present

## 2018-06-16 DIAGNOSIS — R55 Syncope and collapse: Secondary | ICD-10-CM

## 2018-06-16 DIAGNOSIS — K5733 Diverticulitis of large intestine without perforation or abscess with bleeding: Secondary | ICD-10-CM | POA: Diagnosis present

## 2018-06-16 DIAGNOSIS — H409 Unspecified glaucoma: Secondary | ICD-10-CM | POA: Diagnosis present

## 2018-06-16 DIAGNOSIS — I700 Atherosclerosis of aorta: Secondary | ICD-10-CM | POA: Diagnosis present

## 2018-06-16 DIAGNOSIS — K5792 Diverticulitis of intestine, part unspecified, without perforation or abscess without bleeding: Secondary | ICD-10-CM

## 2018-06-16 DIAGNOSIS — E785 Hyperlipidemia, unspecified: Secondary | ICD-10-CM | POA: Diagnosis present

## 2018-06-16 DIAGNOSIS — K921 Melena: Secondary | ICD-10-CM | POA: Diagnosis not present

## 2018-06-16 DIAGNOSIS — K922 Gastrointestinal hemorrhage, unspecified: Secondary | ICD-10-CM | POA: Diagnosis present

## 2018-06-16 DIAGNOSIS — Z66 Do not resuscitate: Secondary | ICD-10-CM | POA: Diagnosis present

## 2018-06-16 DIAGNOSIS — I77811 Abdominal aortic ectasia: Secondary | ICD-10-CM | POA: Diagnosis present

## 2018-06-16 HISTORY — DX: Gout, unspecified: M10.9

## 2018-06-16 HISTORY — DX: Gastrointestinal hemorrhage, unspecified: K92.2

## 2018-06-16 HISTORY — DX: Diverticulitis of intestine, part unspecified, without perforation or abscess without bleeding: K57.92

## 2018-06-16 HISTORY — DX: Syncope and collapse: R55

## 2018-06-16 HISTORY — DX: Hyperlipidemia, unspecified: E78.5

## 2018-06-16 LAB — CBC WITH DIFFERENTIAL/PLATELET
ABS IMMATURE GRANULOCYTES: 0.08 10*3/uL — AB (ref 0.00–0.07)
BASOS ABS: 0 10*3/uL (ref 0.0–0.1)
BASOS PCT: 0 %
Eosinophils Absolute: 0.1 10*3/uL (ref 0.0–0.5)
Eosinophils Relative: 1 %
HCT: 39 % (ref 39.0–52.0)
HEMOGLOBIN: 13 g/dL (ref 13.0–17.0)
Immature Granulocytes: 1 %
LYMPHS PCT: 10 %
Lymphs Abs: 1.1 10*3/uL (ref 0.7–4.0)
MCH: 31.4 pg (ref 26.0–34.0)
MCHC: 33.3 g/dL (ref 30.0–36.0)
MCV: 94.2 fL (ref 80.0–100.0)
Monocytes Absolute: 1 10*3/uL (ref 0.1–1.0)
Monocytes Relative: 8 %
NEUTROS ABS: 9.7 10*3/uL — AB (ref 1.7–7.7)
NRBC: 0 % (ref 0.0–0.2)
Neutrophils Relative %: 80 %
PLATELETS: 157 10*3/uL (ref 150–400)
RBC: 4.14 MIL/uL — AB (ref 4.22–5.81)
RDW: 14.3 % (ref 11.5–15.5)
WBC: 12 10*3/uL — AB (ref 4.0–10.5)

## 2018-06-16 LAB — COMPREHENSIVE METABOLIC PANEL
ALT: 15 U/L (ref 0–44)
ANION GAP: 7 (ref 5–15)
AST: 23 U/L (ref 15–41)
Albumin: 3.2 g/dL — ABNORMAL LOW (ref 3.5–5.0)
Alkaline Phosphatase: 68 U/L (ref 38–126)
BUN: 26 mg/dL — ABNORMAL HIGH (ref 8–23)
CHLORIDE: 108 mmol/L (ref 98–111)
CO2: 22 mmol/L (ref 22–32)
Calcium: 8.5 mg/dL — ABNORMAL LOW (ref 8.9–10.3)
Creatinine, Ser: 0.91 mg/dL (ref 0.61–1.24)
GFR calc non Af Amer: >60 mL/min (ref >60)
Glucose, Bld: 108 mg/dL — ABNORMAL HIGH (ref 70–99)
Potassium: 4 mmol/L (ref 3.5–5.1)
SODIUM: 137 mmol/L (ref 135–145)
Total Bilirubin: 1.4 mg/dL — ABNORMAL HIGH (ref 0.3–1.2)
Total Protein: 5.7 g/dL — ABNORMAL LOW (ref 6.5–8.1)

## 2018-06-16 LAB — MAGNESIUM: Magnesium: 2 mg/dL (ref 1.7–2.4)

## 2018-06-16 LAB — PROTIME-INR
INR: 1.06
PROTHROMBIN TIME: 13.7 s (ref 11.4–15.2)

## 2018-06-16 LAB — APTT: aPTT: 24 seconds (ref 24–36)

## 2018-06-16 MED ORDER — SODIUM CHLORIDE 0.9 % IV SOLN: INTRAVENOUS | Status: DC

## 2018-06-16 MED ORDER — PANTOPRAZOLE SODIUM 40 MG IV SOLR
40.0000 mg | Freq: Two times a day (BID) | INTRAVENOUS | Status: DC
  Administered 2018-06-16 – 2018-06-18 (×4): 40 mg via INTRAVENOUS
  Filled 2018-06-16 (×5): qty 40

## 2018-06-16 MED ORDER — ONDANSETRON HCL 4 MG/2ML IJ SOLN: 4.0000 mg | Freq: Four times a day (QID) | INTRAMUSCULAR | Status: DC | PRN

## 2018-06-16 MED ORDER — PIPERACILLIN-TAZOBACTAM 3.375 G IVPB
3.3750 g | Freq: Three times a day (TID) | INTRAVENOUS | Status: DC
  Administered 2018-06-16 – 2018-06-19 (×8): 3.375 g via INTRAVENOUS
  Filled 2018-06-16 (×8): qty 50

## 2018-06-16 MED ORDER — ACETAMINOPHEN 325 MG PO TABS: 650.0000 mg | ORAL_TABLET | Freq: Four times a day (QID) | ORAL | Status: DC | PRN

## 2018-06-16 MED ORDER — ACETAMINOPHEN 650 MG RE SUPP: 650.0000 mg | Freq: Four times a day (QID) | RECTAL | Status: DC | PRN

## 2018-06-16 MED ORDER — ONDANSETRON HCL 4 MG PO TABS: 4.0000 mg | ORAL_TABLET | Freq: Four times a day (QID) | ORAL | Status: DC | PRN

## 2018-06-16 MED ORDER — SODIUM CHLORIDE 0.9 % IV SOLN
INTRAVENOUS | Status: DC
  Administered 2018-06-16 – 2018-06-18 (×4): via INTRAVENOUS

## 2018-06-16 MED ORDER — ALLOPURINOL 300 MG PO TABS
300.0000 mg | ORAL_TABLET | Freq: Two times a day (BID) | ORAL | Status: DC
  Administered 2018-06-16 – 2018-06-20 (×8): 300 mg via ORAL
  Filled 2018-06-16 (×8): qty 1

## 2018-06-16 NOTE — Progress Notes (Signed)
Pt transferred from [facility] for GI services. Pt orientated to room and unit. No questions or concerns at this time.

## 2018-06-16 NOTE — H&P (Signed)
History and Physical    [facility] WUJ:[id number] DOB: Jun 14, 1932 DOA: 06/16/2018  PCP: [name], MD  Patient coming from: [facility]  I have personally briefly reviewed patient's old medical records in [facility]  Chief Complaint: GI bleed  HPI: [facility] is a 84 y.o. male with medical history significant of gout on allopurinol and indomethacin as needed, hypertension, hyperlipidemia, history of diverticulosis, gastroesophageal reflux disease, glaucoma, prior hospitalization for GI bleed felt secondary to a diverticular source status post clipping( 10/06/2017-10/09/2017), on chronic Plavix who was admitted to [facility] on 06/16/2018 for acute GI bleed.  Patient presented at [facility]: [facility] where noted to have some bloody bowel movements.  CT angiogram which was done was consistent with a sigmoid diverticulitis.  Initial hemoglobin was 13.5 in the ED.  During that hospitalization patient was noted to have a large bowel movement and subsequently a syncopal episode with marketed hypotension and hypovolemia.  Patient was placed in the supine position placed and IV fluids and mental status markedly improved.  Repeat hemoglobin done was approximately 10.  Patient transfused 3 units packed red blood cells and patient transferred to [facility] for further evaluation by GI as [facility] had no GI coverage this weekend. Patient states the day prior to admission around 4:12 PM had a bloody bowel movement not sure of the color but initially felt it was dark.  Patient states he lives in a retirement community and around 10:30 PM the day prior to admission had some more bloody bowel movements called for help however did not get any help and subsequently called his son at 1:30 AM on the day of admission.  Patient's son states when he got there he noted that patient had bloody bowel movement which was bright red and called the ambulance and patient subsequently  transferred to [facility].  Patient does endorse some sharp pain in the left lower quadrant intermittent in nature and associated with his bloody bowel movements.  Patient stated had about 5 bloody bowel movements prior to presentation to the ED.  Patient denies any fevers, no nausea, no vomiting, no dysuria, no constipation, no no headache, no chest pain, no shortness of breath, no change in visual symptoms.  Patient does endorse some chills, some abdominal pain, some diarrhea.  No other associated symptoms. During my interview patient noted to have a bloody bowel movement which was melanotic in nature with some surrounding bright red blood.  Review of Systems: As per HPI otherwise 10 point review of systems negative.   Past Medical History:  Diagnosis Date  . Arthritis   . Glaucoma   . Gout   . Hyperlipidemia   . Hypertension     Past Surgical History:  Procedure Laterality Date  . "navel base repair" per pt    . COLONOSCOPY N/A 10/08/2017   Procedure: COLONOSCOPY;  Surgeon: Napoleon Form, MD;  Location: [facility];  Service: Endoscopy;  Laterality: N/A;  . ESOPHAGOGASTRODUODENOSCOPY N/A 10/07/2017   Procedure: ESOPHAGOGASTRODUODENOSCOPY (EGD);  Surgeon: Sherrilyn Rist, MD;  Location: [facility];  Service: Gastroenterology;  Laterality: N/A;  . HERNIA REPAIR       reports that he has never smoked. He has never used smokeless tobacco. He reports that he does not drink alcohol or use drugs.  No Known Allergies  Family History  Problem Relation Age of Onset  . Heart failure Mother   . CAD Father   . Colon cancer Neg Hx  Mother deceased age 73 from CHF.  Father deceased age 70 from coronary artery disease.  Prior to Admission medications   Medication Sig Start Date End Date Taking? Authorizing Provider  allopurinol (ZYLOPRIM) 300 MG tablet Take 300 mg by mouth 2 (two) times daily.    [provider]  brimonidine (ALPHAGAN) 0.2 % ophthalmic solution  Place 1 drop into both eyes 2 (two) times daily with breakfast and lunch.    [provider]  chlorhexidine (PERIDEX) 0.12 % solution 15 mLs by Mouth Rinse route 2 (two) times daily. Do not swallow 03/14/18   [provider]  clopidogrel (PLAVIX) 75 MG tablet Take 75 mg by mouth at bedtime.    [provider]  dorzolamide-timolol (COSOPT) 22.3-6.8 MG/ML ophthalmic solution Place 1 drop into both eyes 2 (two) times daily with breakfast and lunch. With breakfast and lunch    [provider]  indomethacin (INDOCIN) 50 MG capsule Take 50 mg by mouth 3 (three) times daily as needed (gout flare).    [provider]  labetalol (NORMODYNE) 300 MG tablet Take 300 mg by mouth 2 (two) times daily.    [provider]  latanoprost (XALATAN) 0.005 % ophthalmic solution Place 1 drop into both eyes at bedtime.    [provider]  lisinopril-hydrochlorothiazide (PRINZIDE,ZESTORETIC) 20-25 MG tablet Take 1 tablet by mouth daily.    [provider]  Multiple Vitamin (MULTIVITAMIN WITH MINERALS) TABS tablet Take 1 tablet by mouth daily.    [provider]  naproxen sodium (ALEVE) 220 MG tablet Take 220 mg by mouth daily.    [provider]  pravastatin (PRAVACHOL) 40 MG tablet Take 40 mg by mouth at bedtime.    [provider]    Physical Exam: Vitals:   06/16/18 1532  BP: (!) 151/62  Pulse: 65  Resp: 18  Temp: 98.3 F (36.8 C)  TempSrc: Oral  SpO2: 98%  Weight: 85.5 kg  Height: 5\' 7"  (1.702 m)    Constitutional: NAD, calm, comfortable Vitals:   06/16/18 1532  BP: (!) 151/62  Pulse: 65  Resp: 18  Temp: 98.3 F (36.8 C)  TempSrc: Oral  SpO2: 98%  Weight: 85.5 kg  Height: 5\' 7"  (1.702 m)   Eyes: PERRL, lids and conjunctivae normal ENMT: Mucous membranes are dry. Posterior pharynx clear of any exudate or lesions.Normal dentition.  Neck: normal, supple, no masses, no thyromegaly Respiratory: clear to  auscultation bilaterally, no wheezing, no crackles. Normal respiratory effort. No accessory muscle use.  Cardiovascular: Regular rate and rhythm, no murmurs / rubs / gallops. No extremity edema. 2+ pedal pulses. No carotid bruits.  Abdomen: no tenderness, no masses palpated. No hepatosplenomegaly. Bowel sounds positive.  Musculoskeletal: no clubbing / cyanosis. No joint deformity upper and lower extremities. Good ROM, no contractures. Normal muscle tone.  Skin: no rashes, lesions, ulcers. No induration Neurologic: CN 2-12 grossly intact. Sensation intact, DTR normal. Strength 5/5 in all 4.  Psychiatric: Normal judgment and insight. Alert and oriented x 3. Normal mood.   Labs on Admission: I have personally reviewed following labs and imaging studies  CBC: No results for input(s): WBC, NEUTROABS, HGB, HCT, MCV, PLT in the last 168 hours. Basic Metabolic Panel: No results for input(s): NA, K, CL, CO2, GLUCOSE, BUN, CREATININE, CALCIUM, MG, PHOS in the last 168 hours. GFR: CrCl cannot be calculated (Patient's most recent lab result is older than the maximum 21 days allowed.). Liver Function Tests: No results for input(s): AST, ALT, ALKPHOS,  BILITOT, PROT, ALBUMIN in the last 168 hours. No results for input(s): LIPASE, AMYLASE in the last 168 hours. No results for input(s): AMMONIA in the last 168 hours. Coagulation Profile: No results for input(s): INR, PROTIME in the last 168 hours. Cardiac Enzymes: No results for input(s): CKTOTAL, CKMB, CKMBINDEX, TROPONINI in the last 168 hours. BNP (last 3 results) No results for input(s): PROBNP in the last 8760 hours. HbA1C: No results for input(s): HGBA1C in the last 72 hours. CBG: No results for input(s): GLUCAP in the last 168 hours. Lipid Profile: No results for input(s): CHOL, HDL, LDLCALC, TRIG, CHOLHDL, LDLDIRECT in the last 72 hours. Thyroid Function Tests: No results for input(s): TSH, T4TOTAL, FREET4, T3FREE, THYROIDAB in the last 72  hours. Anemia Panel: No results for input(s): VITAMINB12, FOLATE, FERRITIN, TIBC, IRON, RETICCTPCT in the last 72 hours. Urine analysis: No results found for: COLORURINE, APPEARANCEUR, LABSPEC, PHURINE, GLUCOSEU, HGBUR, BILIRUBINUR, KETONESUR, PROTEINUR, UROBILINOGEN, NITRITE, LEUKOCYTESUR  Radiological Exams on Admission: No results found.  EKG: None  Assessment/Plan Principal Problem:   Acute GI bleeding Active Problems:   Acute blood loss anemia   Essential hypertension   Gout   HLD (hyperlipidemia)   Hematochezia   GI bleed   Diverticulitis   Syncope   Hyperlipidemia   1 acute GI bleed Patient presented with an acute GI bleed.  Concerning for acute diverticular bleed.  CT angiogram was done at outside [facility] which did show a sigmoid diverticulitis, advanced atherosclerosis calcification of aorta, 2.8 cm infrarenal aortic ectasia, cholelithiasis, no abscess or evidence of perforation.  Patient noted to have a prior lower GI bleed in February 2019 felt to be diverticular in nature status post clipping.  Patient also noted to be on indomethacin as needed as well as Aleve as needed with a history of gout.  At [facility] hemoglobin dropped from 13.5-10 after large bloody bowel movement with an associated syncopal episode.  Patient status post transfusion of 3 units packed red blood cells.  Keep n.p.o. for now.  Stat CBC now and every 8 hours.  Check INR.  Check a comprehensive metabolic profile.  Patient noted to have bloody bowel movement during my interview.  Vitals every 4 hours.  IV fluids.  PPI IV every 12 hours.  Hold Plavix.  If patient deteriorates overnight will likely need a bleeding scan and further evaluation and immediate transfer to higher level of care stepdown.  GI consulted and will be following the patient throughout this hospitalization.  2.  Acute blood loss anemia Secondary to problem #1.  Check a CBC.  Follow H&H.  Transfuse as needed.  3.  Acute  diverticulitis Per CT angiogram abdomen and pelvis done at outside [facility].  Place empirically on IV Zosyn.  IV fluids.  Supportive care.  4.  Syncopal episode Likely secondary to problem #1.  Patient had a large bloody bowel movement prior to syncopal episode at [facility].  Patient was noted to regain consciousness with clinical improvement with IV fluids and subsequent improvement with his hypotension.  Follow for now.  Vitals every 4 hours.  5.  Hyperlipidemia Hold statin.  6.  Hypertension We will hold antihypertensive medications for now secondary to problem #1.  Follow.  7.  History of gout Continue home regimen allopurinol.  DVT prophylaxis: SCDs Code Status: DNR Family Communication: Updated patient and son at bedside. Disposition Plan: To be determined. Consults called: Gastroenterology,[place] Admission status: Admit to inpatient.   Ramiro Harvest MD [facility] Pager [phone]  If 7PM-7AM, please contact night-coverage [email or web address] Password [email or web address]  06/16/2018, 5:10 PM

## 2018-06-16 NOTE — Consult Note (Signed)
Consultation  Referring Provider:    Ramiro Harvest, MD  Primary Care Physician:  [name], MD Primary Gastroenterologist:   unassigned      Reason for Consultation:     GI bleed         HPI:   Louis Ivery is a 84 y.o. male with a history of diverticulosis with diverticular bleed in 09/2017, history of chronic plavix use for ?PVD, hypertension, hyperlipidemia, transferred her from [facility] for GI bleeding.   The patient resides in a retirement community, yesterday noted to have a bloody bowel movement (difficult to tell color per patient, he has glaucoma), and then additional episodes last night and called son who brought him to [place]. On presentation there his Hgb was 13. He had a CT angiogram which showed suspected sigmoid diverticulitis but no extravasation of blood. During the course of the day, son reports he had a massive bowel movement of bright red blood associated with syncope and hypotension. His Hgb dropped to 10. He was given 3 units of PRBC. He was then transferred to our hospital.  He reports his last BM was about 5 PM and seemed darker. He reports frequency of bleeding is going down. He does have some intermittent LLQ pain, no fevers, but states he has had intermittent LLQ pain for a while. No vomiting or nausea.  He previously was admitted in February for a GI bleed. EGD at that time was unremarkable for a cause. He had a colonoscopy 10/08/2017 which showed severe left sided diverticulosis, one of which was ulcerated with clot and clipped x 2. No other pathology noted. He's not had recurrent bleeding until yesterday.   Past Medical History:  Diagnosis Date  . Arthritis   . Glaucoma   . Gout   . Hyperlipidemia   . Hypertension     Past Surgical History:  Procedure Laterality Date  . "navel base repair" per pt    . COLONOSCOPY N/A 10/08/2017   Procedure: COLONOSCOPY;  Surgeon: Napoleon Form, MD;  Location: [facility] ENDOSCOPY;  Service: Endoscopy;   Laterality: N/A;  . ESOPHAGOGASTRODUODENOSCOPY N/A 10/07/2017   Procedure: ESOPHAGOGASTRODUODENOSCOPY (EGD);  Surgeon: Sherrilyn Rist, MD;  Location: [facility] ENDOSCOPY;  Service: Gastroenterology;  Laterality: N/A;  . HERNIA REPAIR      Family History  Problem Relation Age of Onset  . Heart failure Mother   . CAD Father   . Colon cancer Neg Hx      Social History   Tobacco Use  . Smoking status: Never Smoker  . Smokeless tobacco: Never Used  Substance Use Topics  . Alcohol use: No    Frequency: Never  . Drug use: No    Prior to Admission medications   Medication Sig Start Date End Date Taking? Authorizing Provider  allopurinol (ZYLOPRIM) 300 MG tablet Take 300 mg by mouth daily.    Yes [provider]  brimonidine (ALPHAGAN) 0.2 % ophthalmic solution Place 1 drop into both eyes 2 (two) times daily with breakfast and lunch.   Yes [provider]  clopidogrel (PLAVIX) 75 MG tablet Take 75 mg by mouth at bedtime.   Yes [provider]  dorzolamide-timolol (COSOPT) 22.3-6.8 MG/ML ophthalmic solution Place 1 drop into the right eye 2 (two) times daily with breakfast and lunch. With breakfast and lunch    Yes [provider]  indomethacin (INDOCIN) 50 MG capsule Take 50 mg by mouth 3 (three) times daily as needed (gout flare).  Yes [provider]  labetalol (NORMODYNE) 300 MG tablet Take 300 mg by mouth 2 (two) times daily.   Yes [provider]  latanoprost (XALATAN) 0.005 % ophthalmic solution Place 1 drop into both eyes at bedtime.   Yes [provider]  lisinopril-hydrochlorothiazide (PRINZIDE,ZESTORETIC) 20-25 MG tablet Take 1 tablet by mouth daily.   Yes [provider]  Multiple Vitamin (MULTIVITAMIN WITH MINERALS) TABS tablet Take 1 tablet by mouth daily.   Yes [provider]  naproxen sodium (ALEVE) 220 MG tablet Take 220 mg by mouth daily as needed (pain).    Yes [provider]    pravastatin (PRAVACHOL) 40 MG tablet Take 40 mg by mouth at bedtime.   Yes [provider]    Current Facility-Administered Medications  Medication Dose Route Frequency Provider Last Rate Last Dose  . 0.9 %  sodium chloride infusion   Intravenous Continuous Rodolph Bong, MD 100 mL/hr at 06/16/18 1743    . acetaminophen (TYLENOL) tablet 650 mg  650 mg Oral Q6H PRN Rodolph Bong, MD       Or  . acetaminophen (TYLENOL) suppository 650 mg  650 mg Rectal Q6H PRN Rodolph Bong, MD      . allopurinol (ZYLOPRIM) tablet 300 mg  300 mg Oral BID Rodolph Bong, MD      . ondansetron [facility]) tablet 4 mg  4 mg Oral Q6H PRN Rodolph Bong, MD       Or  . ondansetron [facility]) injection 4 mg  4 mg Intravenous Q6H PRN Rodolph Bong, MD      . pantoprazole (PROTONIX) injection 40 mg  40 mg Intravenous Q12H Rodolph Bong, MD      . piperacillin-tazobactam (ZOSYN) IVPB 3.375 g  3.375 g Intravenous Q8H Rodolph Bong, MD 12.5 mL/hr at 06/16/18 1734 3.375 g at 06/16/18 1734    Allergies as of 06/16/2018  . (No Known Allergies)     Review of Systems:    As per HPI, otherwise negative  Lab Results  Component Value Date   WBC 12.0 (H) 06/16/2018   HGB 13.0 06/16/2018   HCT 39.0 06/16/2018   MCV 94.2 06/16/2018   PLT 157 06/16/2018   Lab Results  Component Value Date   CREATININE 0.85 10/08/2017   BUN 27 (H) 10/08/2017   NA 141 10/08/2017   K 3.9 10/08/2017   CL 114 (H) 10/08/2017   CO2 18 (L) 10/08/2017   Lab Results  Component Value Date   INR 1.06 06/16/2018   INR 1.11 10/06/2017    Lab Results  Component Value Date   ALT 15 06/16/2018   AST 23 06/16/2018   ALKPHOS 68 06/16/2018   BILITOT 1.4 (H) 06/16/2018        Physical Exam:  Vital signs in last 24 hours: Temp:  [98.3 F (36.8 C)] 98.3 F (36.8 C) (11/01 1532) Pulse Rate:  [65] 65 (11/01 1532) Resp:  [18] 18 (11/01 1532) BP: (151)/(62) 151/62 (11/01 1532) SpO2:  [98 %]  98 % (11/01 1532) Weight:  [85.5 kg] 85.5 kg (11/01 1532) Last BM Date: 06/16/18 General:   Pleasant male in NAD Head:  Normocephalic and atraumatic. Ears:  Normal auditory acuity. Neck:  Supple Lungs:  Respirations even and unlabored. Lungs clear to auscultation bilaterally.    Heart:  Regular rate and rhythm;  Abdomen:  Soft, nondistended, mild LLQ TTP.  No appreciable masses or hepatomegaly.  Rectal:  Not performed.  Msk:  Symmetrical without gross deformities.  Extremities:  Without edema. Neurologic:  Alert and  oriented x4;  grossly normal neurologically. Skin:  Intact without significant lesions or rashes. Psych:  Alert and cooperative. Normal affect.             Impression / Plan:   84 y/o male on chronic Plavix with a history of severe diverticulosis and diverticular bleeding Feb 2019, transferred from [facility] for GI bleeding as described. Multiple episodes of what is reported to be right red blood, although the last episode was darker. Frequency of bleeding has decreased. He responded to PRBC transfusion at [facility] and his Hgb is normal at this time.  CT angio negative for active extravasation earlier today, although with suspicion for sigmoid diverticulitis. Discussed ddx with the patient. He very likely has recurrent diverticular bleeding. Upper GI bleed seems less likely. He is hemodynamically stable at this time, Hgb responded to transfusion, hopefully this resolves on its own.   Recommend: - continue NPO for now - trend Hgb, monitor for recurrent bleeding - continue to hold Plavix - continue antibiotics in case he has diverticulitis. I do not think he has ischemic colitis - if he has significant bleeding again, would recommend tagged RBC scan (in light of CTA done earlier today to reduce contrast exposure). If this is positive would contact IR for embolization, his renal function is good  Please contact me with questions, we will reassess him in the  morning.  Ileene Patrick, MD [facility]

## 2018-06-16 NOTE — Plan of Care (Signed)
  Problem: Safety: Goal: Ability to remain free from injury will improve Outcome: Progressing   

## 2018-06-17 DIAGNOSIS — K5731 Diverticulosis of large intestine without perforation or abscess with bleeding: Secondary | ICD-10-CM

## 2018-06-17 DIAGNOSIS — E785 Hyperlipidemia, unspecified: Secondary | ICD-10-CM

## 2018-06-17 LAB — URINALYSIS, ROUTINE W REFLEX MICROSCOPIC
Bacteria, UA: NONE SEEN
Bilirubin Urine: NEGATIVE
Glucose, UA: NEGATIVE mg/dL
Hgb urine dipstick: NEGATIVE
Ketones, ur: NEGATIVE mg/dL
Leukocytes, UA: NEGATIVE
Nitrite: NEGATIVE
Protein, ur: NEGATIVE mg/dL
Specific Gravity, Urine: 1.019 (ref 1.005–1.030)
pH: 5 (ref 5.0–8.0)

## 2018-06-17 LAB — BASIC METABOLIC PANEL WITH GFR
Anion gap: 8 (ref 5–15)
BUN: 22 mg/dL (ref 8–23)
CO2: 22 mmol/L (ref 22–32)
Calcium: 8.3 mg/dL — ABNORMAL LOW (ref 8.9–10.3)
Chloride: 110 mmol/L (ref 98–111)
Creatinine, Ser: 1.03 mg/dL (ref 0.61–1.24)
GFR calc Af Amer: >60 mL/min (ref >60)
GFR calc non Af Amer: >60 mL/min (ref >60)
Glucose, Bld: 88 mg/dL (ref 70–99)
Potassium: 3.6 mmol/L (ref 3.5–5.1)
Sodium: 140 mmol/L (ref 135–145)

## 2018-06-17 LAB — GLUCOSE, CAPILLARY: Glucose-Capillary: 83 mg/dL (ref 70–99)

## 2018-06-17 LAB — CBC
HCT: 34.2 % — ABNORMAL LOW (ref 39.0–52.0)
Hemoglobin: 11.5 g/dL — ABNORMAL LOW (ref 13.0–17.0)
MCH: 31.3 pg (ref 26.0–34.0)
MCHC: 33.6 g/dL (ref 30.0–36.0)
MCV: 93.2 fL (ref 80.0–100.0)
Platelets: 134 K/uL — ABNORMAL LOW (ref 150–400)
RBC: 3.67 MIL/uL — ABNORMAL LOW (ref 4.22–5.81)
RDW: 15 % (ref 11.5–15.5)
WBC: 7.9 K/uL (ref 4.0–10.5)
nRBC: 0 % (ref 0.0–0.2)

## 2018-06-17 LAB — HEMOGLOBIN AND HEMATOCRIT, BLOOD
HEMATOCRIT: 34.3 % — AB (ref 39.0–52.0)
Hemoglobin: 11.8 g/dL — ABNORMAL LOW (ref 13.0–17.0)

## 2018-06-17 LAB — PROTIME-INR
INR: 1.1
Prothrombin Time: 14.1 s (ref 11.4–15.2)

## 2018-06-17 MED ORDER — POTASSIUM CHLORIDE CRYS ER 20 MEQ PO TBCR
40.0000 meq | EXTENDED_RELEASE_TABLET | Freq: Once | ORAL | Status: AC
  Administered 2018-06-17: 40 meq via ORAL
  Filled 2018-06-17: qty 2

## 2018-06-17 MED ORDER — LABETALOL HCL 300 MG PO TABS
300.0000 mg | ORAL_TABLET | Freq: Two times a day (BID) | ORAL | Status: DC
  Administered 2018-06-17 – 2018-06-20 (×7): 300 mg via ORAL
  Filled 2018-06-17 (×7): qty 1

## 2018-06-17 NOTE — Progress Notes (Signed)
Patient called this RN to bathroom after having a small to moderate amount of bloody stool, dark blood clots noted in toilet, with wiping some dark clots and some brighter red blood noted.  Toilet water NOT discolored red.

## 2018-06-17 NOTE — Plan of Care (Signed)
  Problem: Safety: Goal: Ability to remain free from injury will improve Outcome: Progressing   

## 2018-06-17 NOTE — Progress Notes (Signed)
     [facility] Progress Note  CC:  GI bleed  Subjective: Feeling well.  Bleeding seems to be slowing and seems like old blood.  Complaining of little discomfort in his lower abdomen, which may be the possible diverticulitis seen on imaging.  Hemoglobin is down 1.5 g from yesterday, but probably somewhat dilutional.  Objective:  Vital signs in last 24 hours: Temp:  [97.6 F (36.4 C)-98.3 F (36.8 C)] 97.8 F (36.6 C) (11/02 0708) Pulse Rate:  [65-75] 70 (11/02 0708) Resp:  [16-18] 18 (11/02 0708) BP: (147-160)/(62-72) 160/67 (11/02 0708) SpO2:  [95 %-98 %] 96 % (11/02 0708) Weight:  [85.5 kg] 85.5 kg (11/01 1532) Last BM Date: 06/16/18 General:  Alert, Well-developed, in NAD Heart:  Regular rate and rhythm; no murmurs Pulm:  CTAB.  No increased WOB. Abdomen:  Soft, non-distended.  BS present.  Non-tender. Extremities:  Without edema. Neurologic:  Alert and oriented x 4;  grossly normal neurologically. Psych:  Alert and cooperative. Normal mood and affect.  Intake/Output from previous day: 11/01 0701 - 11/02 0700 In: 1401.7 [P.O.:100; I.V.:1200.7; IV Piggyback:101] Out: 400 [Urine:400]  Lab Results: Recent Labs    06/16/18 1734 06/17/18 0412  WBC 12.0* 7.9  HGB 13.0 11.5*  HCT 39.0 34.2*  PLT 157 134*   BMET Recent Labs    06/16/18 1734 06/17/18 0412  NA 137 140  K 4.0 3.6  CL 108 110  CO2 22 22  GLUCOSE 108* 88  BUN 26* 22  CREATININE 0.91 1.03  CALCIUM 8.5* 8.3*   LFT Recent Labs    06/16/18 1734  PROT 5.7*  ALBUMIN 3.2*  AST 23  ALT 15  ALKPHOS 68  BILITOT 1.4*   PT/INR Recent Labs    06/16/18 1734 06/17/18 0412  LABPROT 13.7 14.1  INR 1.06 1.10   Assessment / Plan: 84 y/o male on chronic Plavix with a history of severe diverticulosis and diverticular bleeding Feb 2019, transferred from [facility] for GI bleeding as described. Multiple episodes of what is reported to be right red blood, although the last episode was  darker. Frequency of bleeding has decreased and seems to be old blood now. He responded to PRBC transfusion at [facility].  Hgb down 1.5 grams this AM but is likely dilutional from IVF's as he says that he has also been urinating a lot more too.  CT angio negative for active extravasation earlier today, although with suspicion for sigmoid diverticulitis. Discussed ddx with the patient. He very likely has recurrent diverticular bleeding. Upper GI bleed seems less likely. He is hemodynamically stable at this time, Hgb responded to transfusion, hopefully this resolves on its own.   - Will give clear liquids. - trend Hgb, monitor for recurrent bleeding - continue to hold Plavix - continue antibiotics in case he has diverticulitis. - if he has significant bleeding again, would recommend tagged RBC scan (in light of CTA done yesterday to reduce contrast exposure). If this is positive would contact IR for embolization, his renal function is good.   LOS: 1 day   Princella Pellegrini. Monika Chestang  06/17/2018, 9:04 AM

## 2018-06-17 NOTE — Evaluation (Signed)
Physical Therapy Evaluation Patient Details Name: Steven Dodson MRN: [id number] DOB: 24-Dec-1931 Today's Date: 06/17/2018   History of Present Illness  Steven Dodson is a 85 y.o. male with a history of diverticulosis with diverticular bleed in 09/2017, history of chronic plavix use for ?PVD, hypertension, hyperlipidemia, transferred her from [facility] for GI bleeding.   Clinical Impression  Pt is currently Independent with basic mobility on the unit. Pt demonstrated no LOB with minimal challenges to balance. Pt resides in a retirement home with a call bell system. Pt is safe to return home when medically stable. Pt currently does not have a skilled PT need and is D/C from acute PT services. Recommend ambulation on the unit with family or friend helping with IV pole if needed.     Follow Up Recommendations No PT follow up    Equipment Recommendations  None recommended by PT    Recommendations for Other Services       Precautions / Restrictions Precautions Precautions: None Restrictions Weight Bearing Restrictions: No      Mobility  Bed Mobility Overal bed mobility: Independent                Transfers Overall transfer level: Modified independent Equipment used: None             General transfer comment: use of UE and increased time for safety  Ambulation/Gait Ambulation/Gait assistance: Independent Gait Distance (Feet): 250 Feet   Gait Pattern/deviations: Step-through pattern;WFL(Within Functional Limits)        Stairs            Wheelchair Mobility    Modified Rankin (Stroke Patients Only)       Balance Overall balance assessment: No apparent balance deficits (not formally assessed)                                           Pertinent Vitals/Pain Pain Assessment: No/denies pain    Home Living Family/patient expects to be discharged to:: Private residence Living Arrangements: Alone   Type of Home:  Other(Comment)(retirement home) Home Access: Level entry;Elevator     Home Layout: One level Home Equipment: Environmental consultant - 2 wheels      Prior Function Level of Independence: Independent               Hand Dominance        Extremity/Trunk Assessment   Upper Extremity Assessment Upper Extremity Assessment: Defer to OT evaluation    Lower Extremity Assessment Lower Extremity Assessment: Overall WFL for tasks assessed       Communication   Communication: No difficulties  Cognition Arousal/Alertness: Awake/alert Behavior During Therapy: WFL for tasks assessed/performed Overall Cognitive Status: Within Functional Limits for tasks assessed                                        General Comments      Exercises     Assessment/Plan    PT Assessment Patent does not need any further PT services  PT Problem List         PT Treatment Interventions      PT Goals (Current goals can be found in the Care Plan section)       Frequency     Barriers to discharge  Co-evaluation               AM-PAC PT "6 Clicks" Daily Activity  Outcome Measure Difficulty turning over in bed (including adjusting bedclothes, sheets and blankets)?: None Difficulty moving from lying on back to sitting on the side of the bed? : None Difficulty sitting down on and standing up from a chair with arms (e.g., wheelchair, bedside commode, etc,.)?: None Help needed moving to and from a bed to chair (including a wheelchair)?: None Help needed walking in hospital room?: None Help needed climbing 3-5 steps with a railing? : A Little 6 Click Score: 23    End of Session   Activity Tolerance: Patient tolerated treatment well Patient left: in bed;with family/visitor present Nurse Communication: Mobility status(Independent with no AD in the halls) PT Visit Diagnosis: Unsteadiness on feet (R26.81)    Time: 1212-1230 PT Time Calculation (min) (ACUTE ONLY): 18  min   Charges:   PT Evaluation $PT Eval Low Complexity: 1 Low         Deforest Maiden [place], PT  Greggory Stallion 06/17/2018, 12:40 PM

## 2018-06-17 NOTE — Plan of Care (Signed)
Patient tolerating clear liquids, had 4 small stools this shift with dark bloody clots associated.  Denies pain.  Ambulates in room with standby assist only.  Both sons at bedside this shift.

## 2018-06-17 NOTE — Progress Notes (Signed)
PROGRESS NOTE    Steven Dodson  RUE:[id number] DOB: June 14, 1932 DOA: 06/16/2018 PCP: [name], MD    Brief Narrative:   Steven Dodson is a 84 y.o. male with medical history significant of gout on allopurinol and indomethacin as needed, hypertension, hyperlipidemia, history of diverticulosis, gastroesophageal reflux disease, glaucoma, prior hospitalization for GI bleed felt secondary to a diverticular source status post clipping( 10/06/2017-10/09/2017), on chronic Plavix who was admitted to [facility] on 06/16/2018 for acute GI bleed.  Patient presented at [facility] where noted to have some bloody bowel movements.  CT angiogram which was done was consistent with a sigmoid diverticulitis.  Initial hemoglobin was 13.5 in the ED.  During that hospitalization patient was noted to have a large bowel movement and subsequently a syncopal episode with marketed hypotension and hypovolemia.  Patient was placed in the supine position placed and IV fluids and mental status markedly improved.  Repeat hemoglobin done was approximately 10.  Patient transfused 3 units packed red blood cells and patient transferred to [facility] for further evaluation by GI as round of hospital had no GI coverage this weekend. Patient states the day prior to admission around 4:12 PM had a bloody bowel movement not sure of the color but initially felt it was dark.  Patient states he lives in a retirement community and around 10:30 PM the day prior to admission had some more bloody bowel movements called for help however did not get any help and subsequently called his son at 1:30 AM on the day of admission.  Patient's son states when he got there he noted that patient had bloody bowel movement which was bright red and called the ambulance and patient subsequently transferred to [facility].  Patient does endorse some sharp pain in the left lower quadrant intermittent in nature and associated with his bloody  bowel movements.  Patient stated had about 5 bloody bowel movements prior to presentation to the ED.  Patient denies any fevers, no nausea, no vomiting, no dysuria, no constipation, no no headache, no chest pain, no shortness of breath, no change in visual symptoms.  Patient does endorse some chills, some abdominal pain, some diarrhea.  No other associated symptoms. During my interview patient noted to have a bloody bowel movement which was melanotic in nature with some surrounding bright red blood.   Assessment & Plan:   Principal Problem:   Acute GI bleeding Active Problems:   Acute blood loss anemia   Essential hypertension   Gout   HLD (hyperlipidemia)   Hematochezia   GI bleed   Diverticulitis   Syncope   Hyperlipidemia  1 acute GI bleed likely diverticular bleed Patient presented with an acute GI bleed.  Concerning for acute diverticular bleed.  CT angiogram was done at outside hospital which did show a sigmoid diverticulitis, advanced atherosclerosis calcification of aorta, 2.8 cm infrarenal aortic ectasia, cholelithiasis, no abscess or evidence of perforation.  Patient noted to have a prior lower GI bleed in February 2019 felt to be diverticular in nature status post clipping.  Patient also noted to be on indomethacin as needed as well as Aleve as needed with a history of gout.  At outside hospital hemoglobin dropped from 13.5-10 after large bloody bowel movement with an associated syncopal episode.  Patient status post transfusion of 3 units packed red blood cells.  Hemoglobin posttransfusion was 13.0.  Hemoglobin today is 11.5.  Patient noted to have dark bloody bowel movement yesterday on presentation.  Patient  denies any further bleeding this morning.  INR at 1.10.  Check H&H this afternoon.  Continue vitals every 4 hours for another 24 hours and if patient remains stable could likely change vitals to pressure shift.  Continue to hold Plavix.  Continue IV PPI every 12 hours.  If  patient deteriorates or has ongoing blood loss, will likely need a bleeding scan for localization, and further evaluation and consideration for IR embolization.  GI following and appreciate input and recommendations.  2.  Acute blood loss anemia Secondary to problem #1.    Patient status post 3 units packed red blood cells at outside hospital prior to admission.  Hemoglobin on presentation posttransfusion was 13.  Hemoglobin this morning at 11.5.  Patient noted to have a dark stool yesterday however none today.  Follow H&H.  GI following.  3.  Acute diverticulitis Per CT angiogram abdomen and pelvis done at outside hospital.  Continue IV Zosyn.  Decrease IV fluids.  Supportive care.   4.  Syncopal episode Likely secondary to problem #1.  Patient had a large bloody bowel movement prior to syncopal episode at outside hospital.  Patient was noted to regain consciousness with clinical improvement with IV fluids and subsequent improvement with his hypotension.  Follow for now.  Vitals every 4 hours.  5.  Hyperlipidemia Hold statin.  Resume on discharge.  6.  Hypertension Antihypertensive blood pressure medications were held on admission secondary to problem #1.  Blood pressure increasing.  Resume home regimen of labetalol.  Follow.   7.  History of gout Continue home regimen allopurinol.   DVT prophylaxis: SCDs Code Status: DNR Family Communication: Updated patient and son at bedside. Disposition Plan: Home once acute GI bleed has resolved, hemoglobin stabilized and tolerating oral intake with clinical improvement.   Consultants:   Gastroenterology: Dr. Adela Lank 06/16/2018  Procedures:   None  Antimicrobials:   IV Zosyn 06/16/2018   Subjective: Patient sitting up in bed eating in [place] ice.  No chest pain.  No shortness of breath.  Patient denies any further bloody bowel movements this morning.  Objective: Vitals:   06/16/18 1532 06/16/18 2151 06/17/18 0244 06/17/18  0708  BP: (!) 151/62 (!) 152/72 (!) 147/63 (!) 160/67  Pulse: 65 75 72 70  Resp: 18 16 16 18   Temp: 98.3 F (36.8 C) 97.6 F (36.4 C) 98.1 F (36.7 C) 97.8 F (36.6 C)  TempSrc: Oral Oral Oral Oral  SpO2: 98% 95% 97% 96%  Weight: 85.5 kg     Height: 5\' 7"  (1.702 m)       Intake/Output Summary (Last 24 hours) at 06/17/2018 1211 Last data filed at 06/17/2018 0600 Gross per 24 hour  Intake 1401.7 ml  Output 400 ml  Net 1001.7 ml   Filed Weights   06/16/18 1532  Weight: 85.5 kg    Examination:  General exam: Appears calm and comfortable  Respiratory system: Clear to auscultation. Respiratory effort normal. Cardiovascular system: S1 & S2 heard, RRR. No JVD, murmurs, rubs, gallops or clicks. No pedal edema. Gastrointestinal system: Abdomen is mildly distended, soft and nontender. No organomegaly or masses felt. Normal bowel sounds heard. Central nervous system: Alert and oriented. No focal neurological deficits. Extremities: Symmetric 5 x 5 power. Skin: No rashes, lesions or ulcers Psychiatry: Judgement and insight appear normal. Mood & affect appropriate.     Data Reviewed: I have personally reviewed following labs and imaging studies  CBC: Recent Labs  Lab 06/16/18 1734 06/17/18 0412  WBC 12.0*  7.9  NEUTROABS 9.7*  --   HGB 13.0 11.5*  HCT 39.0 34.2*  MCV 94.2 93.2  PLT 157 134*   Basic Metabolic Panel: Recent Labs  Lab 06/16/18 1734 06/17/18 0412  NA 137 140  K 4.0 3.6  CL 108 110  CO2 22 22  GLUCOSE 108* 88  BUN 26* 22  CREATININE 0.91 1.03  CALCIUM 8.5* 8.3*  MG 2.0  --    GFR: Estimated Creatinine Clearance: 56.8 mL/min (by C-G formula based on SCr of 1.03 mg/dL). Liver Function Tests: Recent Labs  Lab 06/16/18 1734  AST 23  ALT 15  ALKPHOS 68  BILITOT 1.4*  PROT 5.7*  ALBUMIN 3.2*   No results for input(s): LIPASE, AMYLASE in the last 168 hours. No results for input(s): AMMONIA in the last 168 hours. Coagulation Profile: Recent Labs    Lab 06/16/18 1734 06/17/18 0412  INR 1.06 1.10   Cardiac Enzymes: No results for input(s): CKTOTAL, CKMB, CKMBINDEX, TROPONINI in the last 168 hours. BNP (last 3 results) No results for input(s): PROBNP in the last 8760 hours. HbA1C: No results for input(s): HGBA1C in the last 72 hours. CBG: Recent Labs  Lab 06/17/18 0747  GLUCAP 83   Lipid Profile: No results for input(s): CHOL, HDL, LDLCALC, TRIG, CHOLHDL, LDLDIRECT in the last 72 hours. Thyroid Function Tests: No results for input(s): TSH, T4TOTAL, FREET4, T3FREE, THYROIDAB in the last 72 hours. Anemia Panel: No results for input(s): VITAMINB12, FOLATE, FERRITIN, TIBC, IRON, RETICCTPCT in the last 72 hours. Sepsis Labs: No results for input(s): PROCALCITON, LATICACIDVEN in the last 168 hours.  No results found for this or any previous visit (from the past 240 hour(s)).       Radiology Studies: No results found.      Scheduled Meds: . allopurinol  300 mg Oral BID  . labetalol  300 mg Oral BID  . pantoprazole (PROTONIX) IV  40 mg Intravenous Q12H   Continuous Infusions: . sodium chloride 50 mL/hr at 06/17/18 1154  . piperacillin-tazobactam (ZOSYN)  IV 3.375 g (06/17/18 1025)     LOS: 1 day    Time spent: 40 minutes    Ramiro Harvest, MD [facility] Pager [phone]  If 7PM-7AM, please contact night-coverage [email or web address] Password [email or web address] 06/17/2018, 12:11 PM

## 2018-06-18 LAB — BASIC METABOLIC PANEL
Anion gap: 8 (ref 5–15)
BUN: 16 mg/dL (ref 8–23)
CHLORIDE: 111 mmol/L (ref 98–111)
CO2: 22 mmol/L (ref 22–32)
CREATININE: 1.04 mg/dL (ref 0.61–1.24)
Calcium: 8.5 mg/dL — ABNORMAL LOW (ref 8.9–10.3)
GFR calc Af Amer: >60 mL/min (ref >60)
GLUCOSE: 84 mg/dL (ref 70–99)
Potassium: 3.7 mmol/L (ref 3.5–5.1)
SODIUM: 141 mmol/L (ref 135–145)

## 2018-06-18 LAB — MAGNESIUM: MAGNESIUM: 2.1 mg/dL (ref 1.7–2.4)

## 2018-06-18 LAB — CBC WITH DIFFERENTIAL/PLATELET
Abs Immature Granulocytes: 0.04 10*3/uL (ref 0.00–0.07)
Basophils Absolute: 0 10*3/uL (ref 0.0–0.1)
Basophils Relative: 0 %
EOS PCT: 3 %
Eosinophils Absolute: 0.2 10*3/uL (ref 0.0–0.5)
HCT: 35.2 % — ABNORMAL LOW (ref 39.0–52.0)
HEMOGLOBIN: 11.5 g/dL — AB (ref 13.0–17.0)
Immature Granulocytes: 1 %
LYMPHS PCT: 13 %
Lymphs Abs: 1.1 10*3/uL (ref 0.7–4.0)
MCH: 30.8 pg (ref 26.0–34.0)
MCHC: 32.7 g/dL (ref 30.0–36.0)
MCV: 94.4 fL (ref 80.0–100.0)
MONO ABS: 0.8 10*3/uL (ref 0.1–1.0)
MONOS PCT: 10 %
Neutro Abs: 6.2 10*3/uL (ref 1.7–7.7)
Neutrophils Relative %: 73 %
Platelets: 144 10*3/uL — ABNORMAL LOW (ref 150–400)
RBC: 3.73 MIL/uL — AB (ref 4.22–5.81)
RDW: 14.6 % (ref 11.5–15.5)
WBC: 8.4 10*3/uL (ref 4.0–10.5)
nRBC: 0 % (ref 0.0–0.2)

## 2018-06-18 LAB — HEMOGLOBIN AND HEMATOCRIT, BLOOD
HEMATOCRIT: 34 % — AB (ref 39.0–52.0)
Hemoglobin: 11.2 g/dL — ABNORMAL LOW (ref 13.0–17.0)

## 2018-06-18 LAB — GLUCOSE, CAPILLARY: Glucose-Capillary: 83 mg/dL (ref 70–99)

## 2018-06-18 LAB — URINE CULTURE: Culture: NO GROWTH

## 2018-06-18 MED ORDER — PANTOPRAZOLE SODIUM 40 MG PO TBEC
40.0000 mg | DELAYED_RELEASE_TABLET | Freq: Every day | ORAL | Status: DC
  Administered 2018-06-19 – 2018-06-20 (×2): 40 mg via ORAL
  Filled 2018-06-18 (×2): qty 1

## 2018-06-18 NOTE — Plan of Care (Signed)
Patient with 3 stools with blood clots, clots and dark blood with wiping.  Tolerating full liquid diet.  Denies pain.  Son at bedside for majority of shift.

## 2018-06-18 NOTE — Progress Notes (Signed)
     [facility] Progress Note  CC:  GI bleed  Subjective:  Feels pretty good.  Still passing small amounts of blood in the form of clots fairly frequently.  Hgb is stable, however.  Objective:  Vital signs in last 24 hours: Temp:  [97.6 F (36.4 C)-98.2 F (36.8 C)] 98.1 F (36.7 C) (11/03 0605) Pulse Rate:  [59-63] 59 (11/03 0605) Resp:  [14-16] 16 (11/03 0605) BP: (138-157)/(49-58) 138/49 (11/03 0605) SpO2:  [96 %-98 %] 98 % (11/03 0605) Weight:  [84.6 kg] 84.6 kg (11/03 0605) Last BM Date: 06/18/18 General:  Alert, Well-developed, in NAD Heart:  Regular rate and rhythm; no murmurs Pulm:  CTAB.  No increased WOB. Abdomen:  Soft, non-distended.  BS present.  Minimal LLQ TTP.  Extremities:  Without edema. Neurologic:  Alert and oriented x 4;  grossly normal neurologically. Psych:  Alert and cooperative. Normal mood and affect.  Intake/Output from previous day: 11/02 0701 - 11/03 0700 In: 2002 [P.O.:360; I.V.:1406.8; IV Piggyback:235.2] Out: -   Lab Results: Recent Labs    06/16/18 1734 06/17/18 0412 06/17/18 1811 06/18/18 0509  WBC 12.0* 7.9  --  8.4  HGB 13.0 11.5* 11.8* 11.5*  HCT 39.0 34.2* 34.3* 35.2*  PLT 157 134*  --  144*   BMET Recent Labs    06/16/18 1734 06/17/18 0412 06/18/18 0509  NA 137 140 141  K 4.0 3.6 3.7  CL 108 110 111  CO2 22 22 22   GLUCOSE 108* 88 84  BUN 26* 22 16  CREATININE 0.91 1.03 1.04  CALCIUM 8.5* 8.3* 8.5*   LFT Recent Labs    06/16/18 1734  PROT 5.7*  ALBUMIN 3.2*  AST 23  ALT 15  ALKPHOS 68  BILITOT 1.4*   PT/INR Recent Labs    06/16/18 1734 06/17/18 0412  LABPROT 13.7 14.1  INR 1.06 1.10   Assessment / Plan: *84 y/o male on chronic Plavix with a history of severe diverticulosis and diverticular bleeding Feb 2019, transferred from [facility] for GI bleeding as described.  Suspect recurrent diverticular bleed.  CT angio negative for active extravasation earlier today, although with  suspicion for sigmoid diverticulitis.  Hgb responded to transfusion and has remained stable.  Small small amounts of clotted blood, likely old blood.  - Will advance to full liquids. - trend Hgb, monitor for recurrent bleeding - continue to hold Plavix - continue antibiotics in case he has diverticulitis. - if he has significant bleeding again, would recommend tagged RBC scan (in light of CTA already done to reduce contrast exposure). If this is positive would contact IR for embolization, his renal function is good.   LOS: 2 days   Princella Pellegrini. Iseah Plouff  06/18/2018, 9:05 AM

## 2018-06-18 NOTE — Evaluation (Signed)
Occupational Therapy Evaluation Patient Details Name: Steven Dodson MRN: [id number] DOB: 07/01/1932 Today's Date: 06/18/2018    History of Present Illness Steven Dodson is a 84 y.o. male with a history of diverticulosis with diverticular bleed in 09/2017, history of chronic plavix use for ?PVD, hypertension, hyperlipidemia, transferred her from [facility] for GI bleeding.    Clinical Impression   Pt admitted with above diagnoses, presents completing mobility and ADLs at baseline. Pt completing functional mobility and toileting at mod I upon arrival. Pt mentioning difficulty bathing LEs and in the shower. Gave education on shower seat and long handled sponge, handouts given and pt very appreciative and in understanding. Pt lives in retirement community at baseline with accessible fixtures. Pt mentions difficulty with glaucoma, mentioned pt use contrast lighting and night light at home, pt in understanding. No further OT needs identified, OT will sign off. Reconsult welcomed if changes arise.    Follow Up Recommendations  No OT follow up    Equipment Recommendations  Tub/shower seat;Other (comment)(long handled sponge)    Recommendations for Other Services       Precautions / Restrictions Precautions Precautions: None Restrictions Weight Bearing Restrictions: No      Mobility Bed Mobility Overal bed mobility: Independent                Transfers Overall transfer level: Modified independent Equipment used: None             General transfer comment: use of UE and increased time for safety    Balance Overall balance assessment: No apparent balance deficits (not formally assessed)                                         ADL either performed or assessed with clinical judgement   ADL Overall ADL's : Needs assistance/impaired Eating/Feeding: Independent   Grooming: Modified independent;Standing   Upper Body Bathing: Set  up;Sitting;Standing Upper Body Bathing Details (indicate cue type and reason): currently stands Lower Body Bathing: Set up;Sit to/from stand   Upper Body Dressing : Modified independent   Lower Body Dressing: Modified independent   Toilet Transfer: Supervision/safety;Regular Toilet;RW   Toileting- Clothing Manipulation and Hygiene: Set up   Tub/ Shower Transfer: Supervision/safety;Shower Field seismologist Details (indicate cue type and reason): currently stand, but reports increasing difficulty washing legs Functional mobility during ADLs: Modified independent General ADL Comments: Pt presents completing funcitonal mobility at mod I, no AD, and ADLS at baseline., Pt is reporting difficulty withe LE bathing, spent session giving recommendations for shower seat and long handled bathing equipment to improve independence and safety.     Vision Baseline Vision/History: Glaucoma;Cataracts Patient Visual Report: Blurring of vision;Other (comment)(reports difficulty with dim lighting) Additional Comments: brief peripheral assessment given, pt reports most difficulty with dim lighting and navigating community after dark     Perception     Praxis      Pertinent Vitals/Pain Pain Assessment: No/denies pain     Hand Dominance     Extremity/Trunk Assessment Upper Extremity Assessment Upper Extremity Assessment: Overall WFL for tasks assessed   Lower Extremity Assessment Lower Extremity Assessment: Overall WFL for tasks assessed       Communication Communication Communication: No difficulties   Cognition Arousal/Alertness: Awake/alert Behavior During Therapy: WFL for tasks assessed/performed Overall Cognitive Status: Within Functional Limits for tasks assessed  General Comments       Exercises     Shoulder Instructions      Home Living Family/patient expects to be discharged to:: Private residence Living  Arrangements: Alone Available Help at Discharge: Family Type of Home: Other(Comment)(retirement home) Home Access: Level entry;Elevator     Home Layout: One level               Home Equipment: Environmental consultant - 2 wheels          Prior Functioning/Environment Level of Independence: Independent                 OT Problem List:        OT Treatment/Interventions:      OT Goals(Current goals can be found in the care plan section) Acute Rehab OT Goals Patient Stated Goal: to go home and be ind OT Goal Formulation: With patient Time For Goal Achievement: 07/02/18 Potential to Achieve Goals: Good  OT Frequency:     Barriers to D/C:            Co-evaluation              AM-PAC PT "6 Clicks" Daily Activity     Outcome Measure Help from another person eating meals?: None Help from another person taking care of personal grooming?: None Help from another person toileting, which includes using toliet, bedpan, or urinal?: None Help from another person bathing (including washing, rinsing, drying)?: A Little Help from another person to put on and taking off regular upper body clothing?: None Help from another person to put on and taking off regular lower body clothing?: None 6 Click Score: 23   End of Session    Activity Tolerance: Patient tolerated treatment well Patient left: in bed;with family/visitor present;with call bell/phone within reach;Other (comment)(requests to remain sitting on side of bed)  OT Visit Diagnosis: Other abnormalities of gait and mobility (R26.89)                Time: 1010-1030 OT Time Calculation (min): 20 min Charges:  OT General Charges $OT Visit: 1 Visit OT Evaluation $OT Eval Low Complexity: 1 Low  Dalphine Handing, MSOT, OTR/L Behavioral Health OT/ Acute Relief OT [facility] Office: [phone]   Dalphine Handing 06/18/2018, 10:50 AM

## 2018-06-18 NOTE — Progress Notes (Signed)
PROGRESS NOTE    Steven Dodson  ZOX:[id number] DOB: March 20, 1932 DOA: 06/16/2018 PCP: [name], MD    Brief Narrative:   Steven Dodson is a 84 y.o. male with medical history significant of gout on allopurinol and indomethacin as needed, hypertension, hyperlipidemia, history of diverticulosis, gastroesophageal reflux disease, glaucoma, prior hospitalization for GI bleed felt secondary to a diverticular source status post clipping( 10/06/2017-10/09/2017), on chronic Plavix who was admitted to [facility] on 06/16/2018 for acute GI bleed.  Patient presented at [facility] where noted to have some bloody bowel movements.  CT angiogram which was done was consistent with a sigmoid diverticulitis.  Initial hemoglobin was 13.5 in the ED.  During that hospitalization patient was noted to have a large bowel movement and subsequently a syncopal episode with marketed hypotension and hypovolemia.  Patient was placed in the supine position placed and IV fluids and mental status markedly improved.  Repeat hemoglobin done was approximately 10.  Patient transfused 3 units packed red blood cells and patient transferred to [facility] for further evaluation by GI as round of hospital had no GI coverage this weekend. Patient states the day prior to admission around 4:12 PM had a bloody bowel movement not sure of the color but initially felt it was dark.  Patient states he lives in a [place] community and around 10:30 PM the day prior to admission had some more bloody bowel movements called for help however did not get any help and subsequently called his son at 1:30 AM on the day of admission.  Patient's son states when he got there he noted that patient had bloody bowel movement which was bright red and called the ambulance and patient subsequently transferred to [facility].  Patient does endorse some sharp pain in the left lower quadrant intermittent in nature and associated with his bloody  bowel movements.  Patient stated had about 5 bloody bowel movements prior to presentation to the ED.  Patient denies any fevers, no nausea, no vomiting, no dysuria, no constipation, no no headache, no chest pain, no shortness of breath, no change in visual symptoms.  Patient does endorse some chills, some abdominal pain, some diarrhea.  No other associated symptoms. During my interview patient noted to have a bloody bowel movement which was melanotic in nature with some surrounding bright red blood.   Assessment & Plan:   Principal Problem:   Acute GI bleeding Active Problems:   Acute blood loss anemia   Essential hypertension   Gout   HLD (hyperlipidemia)   Hematochezia   Diverticulosis of colon with hemorrhage   GI bleed   Diverticulitis   Syncope   Hyperlipidemia  1 acute GI bleed likely diverticular bleed Patient presented with an acute GI bleed.  Concerning for acute diverticular bleed.  CT angiogram was done at outside hospital which did show a sigmoid diverticulitis, advanced atherosclerosis calcification of aorta, 2.8 cm infrarenal aortic ectasia, cholelithiasis, no abscess or evidence of perforation.  Patient noted to have a prior lower GI bleed in February 2019 felt to be diverticular in nature status post clipping.  Patient also noted to be on indomethacin as needed as well as Aleve as needed with a history of gout.  At outside hospital hemoglobin dropped from 13.5-10 after large bloody bowel movement with an associated syncopal episode.  Patient status post transfusion of 3 units packed red blood cells.  Hemoglobin posttransfusion was 13.0.  Hemoglobin today is 11.5.  Patient noted to have small bloody  bowel movements in the form of clots.  Amount of bloody bowel movement seems to be decreasing.  Patient tolerating current diet.  INR at 1.10.  Check H&H this afternoon.  Change vitals to q. routine.  Continue to hold Plavix.  Change IV PPI to p.o.  Hemoglobin seems to be stabilizing.   If patient deteriorates or has ongoing blood loss, will likely need a bleeding scan for localization, and further evaluation and consideration for IR embolization.  GI following and appreciate input and recommendations.  2.  Acute blood loss anemia Secondary to problem #1.    Patient status post 3 units packed red blood cells at outside hospital prior to admission.  Hemoglobin on presentation posttransfusion was 13.  Hemoglobin this morning at 11.5.  Patient noted to be passing small amounts of blood in the form of clots.  Hemoglobin stable.  Repeat H&H this afternoon.  GI following.   3.  Acute diverticulitis Per CT angiogram abdomen and pelvis done at outside hospital.  Continue IV Zosyn.  Saline lock IV fluids.  Likely transition to oral Augmentin tomorrow.  Continue supportive care.   4.  Syncopal episode Likely secondary to problem #1.  Patient had a large bloody bowel movement prior to syncopal episode at outside hospital.  Patient was noted to regain consciousness with clinical improvement with IV fluids and subsequent improvement with his hypotension.  No further syncopal episodes.  Change vitals to q. routine.  5.  Hyperlipidemia Statin on hold.  Resume on discharge.    6.  Hypertension Antihypertensive blood pressure medications were held on admission secondary to problem #1.  Labetalol resumed.  Follow.   7.  History of gout Allopurinol.     DVT prophylaxis: SCDs Code Status: DNR Family Communication: Updated patient and son at bedside. Disposition Plan: Home once acute GI bleed has resolved, hemoglobin stabilized and tolerating oral intake with clinical improvement and per gastroenterology.   Consultants:   Gastroenterology: Dr. Adela Lank 06/16/2018  Procedures:   None  Antimicrobials:   IV Zosyn 06/16/2018   Subjective: Patient sitting on commode.  Per RN patient passing small clots of blood.  Patient denies any chest pain.  No shortness of breath.  Patient  tolerating current diet.  Amount of blood has decreased per patient.   Objective: Vitals:   06/17/18 1731 06/17/18 2119 06/18/18 0118 06/18/18 0605  BP: (!) 154/58 (!) 157/55 (!) 145/53 (!) 138/49  Pulse: 62 (!) 59 63 (!) 59  Resp: 16 16 16 16   Temp: 97.7 F (36.5 C) 98.2 F (36.8 C) 97.6 F (36.4 C) 98.1 F (36.7 C)  TempSrc: Oral Oral Oral Oral  SpO2: 98% 97% 98% 98%  Weight:    84.6 kg  Height:        Intake/Output Summary (Last 24 hours) at 06/18/2018 1302 Last data filed at 06/18/2018 0900 Gross per 24 hour  Intake 1962.01 ml  Output -  Net 1962.01 ml   Filed Weights   06/16/18 1532 06/18/18 0605  Weight: 85.5 kg 84.6 kg    Examination:  General exam: NAD Respiratory system: CTAB.  No wheezes, no crackles, no rhonchi.   Cardiovascular system: Regular rate rhythm no murmurs rubs or gallops.  No JVD.  No lower extremity edema.  Gastrointestinal system: Abdomen is mildly distended, soft and nontender. No organomegaly or masses felt. Normal bowel sounds heard. Central nervous system: Alert and oriented. No focal neurological deficits. Extremities: Symmetric 5 x 5 power. Skin: No rashes, lesions or ulcers Psychiatry:  Judgement and insight appear normal. Mood & affect appropriate.     Data Reviewed: I have personally reviewed following labs and imaging studies  CBC: Recent Labs  Lab 06/16/18 1734 06/17/18 0412 06/17/18 1811 06/18/18 0509  WBC 12.0* 7.9  --  8.4  NEUTROABS 9.7*  --   --  6.2  HGB 13.0 11.5* 11.8* 11.5*  HCT 39.0 34.2* 34.3* 35.2*  MCV 94.2 93.2  --  94.4  PLT 157 134*  --  144*   Basic Metabolic Panel: Recent Labs  Lab 06/16/18 1734 06/17/18 0412 06/18/18 0509  NA 137 140 141  K 4.0 3.6 3.7  CL 108 110 111  CO2 22 22 22   GLUCOSE 108* 88 84  BUN 26* 22 16  CREATININE 0.91 1.03 1.04  CALCIUM 8.5* 8.3* 8.5*  MG 2.0  --  2.1   GFR: Estimated Creatinine Clearance: 55.9 mL/min (by C-G formula based on SCr of 1.04 mg/dL). Liver  Function Tests: Recent Labs  Lab 06/16/18 1734  AST 23  ALT 15  ALKPHOS 68  BILITOT 1.4*  PROT 5.7*  ALBUMIN 3.2*   No results for input(s): LIPASE, AMYLASE in the last 168 hours. No results for input(s): AMMONIA in the last 168 hours. Coagulation Profile: Recent Labs  Lab 06/16/18 1734 06/17/18 0412  INR 1.06 1.10   Cardiac Enzymes: No results for input(s): CKTOTAL, CKMB, CKMBINDEX, TROPONINI in the last 168 hours. BNP (last 3 results) No results for input(s): PROBNP in the last 8760 hours. HbA1C: No results for input(s): HGBA1C in the last 72 hours. CBG: Recent Labs  Lab 06/17/18 0747 06/18/18 0755  GLUCAP 83 83   Lipid Profile: No results for input(s): CHOL, HDL, LDLCALC, TRIG, CHOLHDL, LDLDIRECT in the last 72 hours. Thyroid Function Tests: No results for input(s): TSH, T4TOTAL, FREET4, T3FREE, THYROIDAB in the last 72 hours. Anemia Panel: No results for input(s): VITAMINB12, FOLATE, FERRITIN, TIBC, IRON, RETICCTPCT in the last 72 hours. Sepsis Labs: No results for input(s): PROCALCITON, LATICACIDVEN in the last 168 hours.  Recent Results (from the past 240 hour(s))  Urine culture     Status: None   Collection Time: 06/17/18 12:44 AM  Result Value Ref Range Status   Specimen Description URINE, CLEAN CATCH  Final   Special Requests   Final    NONE Performed at [facility], [address], [place], [place] [address]    Culture NO GROWTH  Final   Report Status 06/18/2018 FINAL  Final         Radiology Studies: No results found.      Scheduled Meds: . allopurinol  300 mg Oral BID  . labetalol  300 mg Oral BID  . pantoprazole (PROTONIX) IV  40 mg Intravenous Q12H   Continuous Infusions: . sodium chloride 50 mL/hr at 06/18/18 0923  . piperacillin-tazobactam (ZOSYN)  IV 3.375 g (06/18/18 0926)     LOS: 2 days    Time spent: 40 minutes    Ramiro Harvest, MD [facility] Pager [phone]  If 7PM-7AM, please  contact night-coverage [email or web address] Password [email or web address] 06/18/2018, 1:02 PM

## 2018-06-19 LAB — BASIC METABOLIC PANEL
ANION GAP: 9 (ref 5–15)
BUN: 12 mg/dL (ref 8–23)
CALCIUM: 8.4 mg/dL — AB (ref 8.9–10.3)
CO2: 20 mmol/L — AB (ref 22–32)
Chloride: 112 mmol/L — ABNORMAL HIGH (ref 98–111)
Creatinine, Ser: 1.05 mg/dL (ref 0.61–1.24)
GFR calc non Af Amer: >60 mL/min (ref >60)
GLUCOSE: 96 mg/dL (ref 70–99)
POTASSIUM: 3.5 mmol/L (ref 3.5–5.1)
Sodium: 141 mmol/L (ref 135–145)

## 2018-06-19 LAB — CBC
HEMATOCRIT: 33.5 % — AB (ref 39.0–52.0)
Hemoglobin: 11.2 g/dL — ABNORMAL LOW (ref 13.0–17.0)
MCH: 31.3 pg (ref 26.0–34.0)
MCHC: 33.4 g/dL (ref 30.0–36.0)
MCV: 93.6 fL (ref 80.0–100.0)
NRBC: 0 % (ref 0.0–0.2)
Platelets: 161 10*3/uL (ref 150–400)
RBC: 3.58 MIL/uL — AB (ref 4.22–5.81)
RDW: 14.3 % (ref 11.5–15.5)
WBC: 7.1 10*3/uL (ref 4.0–10.5)

## 2018-06-19 LAB — GLUCOSE, CAPILLARY: Glucose-Capillary: 82 mg/dL (ref 70–99)

## 2018-06-19 MED ORDER — AMOXICILLIN-POT CLAVULANATE 875-125 MG PO TABS
1.0000 | ORAL_TABLET | Freq: Two times a day (BID) | ORAL | Status: DC
  Administered 2018-06-19 – 2018-06-20 (×3): 1 via ORAL
  Filled 2018-06-19 (×3): qty 1

## 2018-06-19 MED ORDER — SIMETHICONE 80 MG PO CHEW
80.0000 mg | CHEWABLE_TABLET | Freq: Four times a day (QID) | ORAL | Status: DC
  Administered 2018-06-19: 80 mg via ORAL
  Filled 2018-06-19: qty 1

## 2018-06-19 MED ORDER — POTASSIUM CHLORIDE CRYS ER 20 MEQ PO TBCR
40.0000 meq | EXTENDED_RELEASE_TABLET | Freq: Once | ORAL | Status: AC
  Administered 2018-06-19: 40 meq via ORAL
  Filled 2018-06-19: qty 2

## 2018-06-19 NOTE — Progress Notes (Signed)
PROGRESS NOTE    Steven Dodson  UJW:[id number] DOB: 1932/07/13 DOA: 06/16/2018 PCP: [name], MD    Brief Narrative:   Steven Dodson is a 84 y.o. male with medical history significant of gout on allopurinol and indomethacin as needed, hypertension, hyperlipidemia, history of diverticulosis, gastroesophageal reflux disease, glaucoma, prior hospitalization for GI bleed felt secondary to a diverticular source status post clipping( 10/06/2017-10/09/2017), on chronic Plavix who was admitted to [facility] on 06/16/2018 for acute GI bleed.  Patient presented at [facility] where noted to have some bloody bowel movements.  CT angiogram which was done was consistent with a sigmoid diverticulitis.  Initial hemoglobin was 13.5 in the ED.  During that hospitalization patient was noted to have a large bowel movement and subsequently a syncopal episode with marketed hypotension and hypovolemia.  Patient was placed in the supine position placed and IV fluids and mental status markedly improved.  Repeat hemoglobin done was approximately 10.  Patient transfused 3 units packed red blood cells and patient transferred to [facility] for further evaluation by GI as round of hospital had no GI coverage this weekend. Patient states the day prior to admission around 4:12 PM had a bloody bowel movement not sure of the color but initially felt it was dark.  Patient states he lives in a retirement community and around 10:30 PM the day prior to admission had some more bloody bowel movements called for help however did not get any help and subsequently called his son at 1:30 AM on the day of admission.  Patient's son states when he got there he noted that patient had bloody bowel movement which was bright red and called the ambulance and patient subsequently transferred to [facility].  Patient does endorse some sharp pain in the left lower quadrant intermittent in nature and associated with his bloody  bowel movements.  Patient stated had about 5 bloody bowel movements prior to presentation to the ED.  Patient denies any fevers, no nausea, no vomiting, no dysuria, no constipation, no no headache, no chest pain, no shortness of breath, no change in visual symptoms.  Patient does endorse some chills, some abdominal pain, some diarrhea.  No other associated symptoms. During my interview patient noted to have a bloody bowel movement which was melanotic in nature with some surrounding bright red blood.   Assessment & Plan:   Principal Problem:   Acute GI bleeding Active Problems:   Acute blood loss anemia   Essential hypertension   Gout   HLD (hyperlipidemia)   Hematochezia   Diverticulosis of colon with hemorrhage   GI bleed   Diverticulitis   Syncope   Hyperlipidemia  1 acute GI bleed likely diverticular bleed Patient presented with an acute GI bleed.  Concerning for acute diverticular bleed.  CT angiogram was done at outside hospital which did show a sigmoid diverticulitis, advanced atherosclerosis calcification of aorta, 2.8 cm infrarenal aortic ectasia, cholelithiasis, no abscess or evidence of perforation.  Patient noted to have a prior lower GI bleed in February 2019 felt to be diverticular in nature status post clipping.  Patient also noted to be on indomethacin as needed as well as Aleve as needed with a history of gout.  At outside hospital hemoglobin dropped from 13.5-10 after large bloody bowel movement with an associated syncopal episode.  Patient status post transfusion of 3 units packed red blood cells.  Hemoglobin posttransfusion was 13.0.  Hemoglobin today is 11.2.  Patient noted to have small bloody  bowel movements in the form of clots.  Amount of bloody bowel movement seems to be decreasing.  Patient tolerating current diet.  INR at 1.10.  Continue to hold Plavix.  Changed IV PPI to p.o.  Hemoglobin seems to be stabilizing.  Patient's diet has been advanced to a soft diet. If  patient deteriorates or has ongoing blood loss, will likely need a bleeding scan for localization, and further evaluation and consideration for IR embolization.  GI following and appreciate input and recommendations.  2.  Acute blood loss anemia Secondary to problem #1.    Patient status post 3 units packed red blood cells at outside hospital prior to admission.  Hemoglobin on presentation posttransfusion was 13.  Hemoglobin this morning at 11.2.  Patient noted to be passing small amounts of blood in the form of clots which is improving.  Hemoglobin stable.  GI following.   3.  Acute diverticulitis Per CT angiogram abdomen and pelvis done at outside hospital.  Clinical improvement.  Narrow antibiotics from IV Zosyn to oral Augmentin to complete course of antibiotic treatment.  Continue supportive care.    4.  Syncopal episode Likely secondary to problem #1.  Patient had a large bloody bowel movement prior to syncopal episode at outside hospital.  Patient was noted to regain consciousness with clinical improvement with IV fluids and subsequent improvement with his hypotension.  No further syncopal episodes.  5.  Hyperlipidemia Statin on hold.  Resume on discharge.    6.  Hypertension Antihypertensive blood pressure medications were held on admission secondary to problem #1.  Labetalol resumed.  Follow.   7.  History of gout Allopurinol.     DVT prophylaxis: SCDs Code Status: DNR Family Communication: Updated patient.  No family at bedside.  Disposition Plan: Home once acute GI bleed has resolved, hemoglobin stabilized and tolerating oral intake with clinical improvement and per gastroenterology hopefully in the next 24 to 48 hours.   Consultants:   Gastroenterology: Dr. Adela Lank 06/16/2018  Procedures:   None  Antimicrobials:   IV Zosyn 06/16/2018>>>> 06/19/2018  Augmentin 06/19/2018   Subjective: Patient sitting up in chair eating a sandwich.  Denies any bowel movements  today.  No chest pain.  No shortness of breath.  No abdominal pain.    Objective: Vitals:   06/18/18 2154 06/19/18 0413 06/19/18 0417 06/19/18 1342  BP: (!) 152/57  (!) 164/61 (!) 139/57  Pulse: 61  70 62  Resp:   20 16  Temp:   98 F (36.7 C) 97.9 F (36.6 C)  TempSrc:   Oral Oral  SpO2:   98% 99%  Weight:  85.5 kg    Height:        Intake/Output Summary (Last 24 hours) at 06/19/2018 1811 Last data filed at 06/19/2018 1758 Gross per 24 hour  Intake 592.5 ml  Output -  Net 592.5 ml   Filed Weights   06/16/18 1532 06/18/18 0605 06/19/18 0413  Weight: 85.5 kg 84.6 kg 85.5 kg    Examination:  General exam: NAD Respiratory system: Lungs clear to auscultation bilaterally.  No wheezes, no crackles, no rhonchi.   Cardiovascular system: RRR no murmurs rubs or gallops.  No JVD.  No lower extremity edema  Gastrointestinal system: Abdomen is mildly distended, soft and nontender. No organomegaly or masses felt. Normal bowel sounds heard. Central nervous system: Alert and oriented. No focal neurological deficits. Extremities: Symmetric 5 x 5 power. Skin: No rashes, lesions or ulcers Psychiatry: Judgement and insight appear normal.  Mood & affect appropriate.     Data Reviewed: I have personally reviewed following labs and imaging studies  CBC: Recent Labs  Lab 06/16/18 1734 06/17/18 0412 06/17/18 1811 06/18/18 0509 06/18/18 1556 06/19/18 0446  WBC 12.0* 7.9  --  8.4  --  7.1  NEUTROABS 9.7*  --   --  6.2  --   --   HGB 13.0 11.5* 11.8* 11.5* 11.2* 11.2*  HCT 39.0 34.2* 34.3* 35.2* 34.0* 33.5*  MCV 94.2 93.2  --  94.4  --  93.6  PLT 157 134*  --  144*  --  161   Basic Metabolic Panel: Recent Labs  Lab 06/16/18 1734 06/17/18 0412 06/18/18 0509 06/19/18 0446  NA 137 140 141 141  K 4.0 3.6 3.7 3.5  CL 108 110 111 112*  CO2 22 22 22  20*  GLUCOSE 108* 88 84 96  BUN 26* 22 16 12   CREATININE 0.91 1.03 1.04 1.05  CALCIUM 8.5* 8.3* 8.5* 8.4*  MG 2.0  --  2.1  --     GFR: Estimated Creatinine Clearance: 55.7 mL/min (by C-G formula based on SCr of 1.05 mg/dL). Liver Function Tests: Recent Labs  Lab 06/16/18 1734  AST 23  ALT 15  ALKPHOS 68  BILITOT 1.4*  PROT 5.7*  ALBUMIN 3.2*   No results for input(s): LIPASE, AMYLASE in the last 168 hours. No results for input(s): AMMONIA in the last 168 hours. Coagulation Profile: Recent Labs  Lab 06/16/18 1734 06/17/18 0412  INR 1.06 1.10   Cardiac Enzymes: No results for input(s): CKTOTAL, CKMB, CKMBINDEX, TROPONINI in the last 168 hours. BNP (last 3 results) No results for input(s): PROBNP in the last 8760 hours. HbA1C: No results for input(s): HGBA1C in the last 72 hours. CBG: Recent Labs  Lab 06/17/18 0747 06/18/18 0755 06/19/18 0734  GLUCAP 83 83 82   Lipid Profile: No results for input(s): CHOL, HDL, LDLCALC, TRIG, CHOLHDL, LDLDIRECT in the last 72 hours. Thyroid Function Tests: No results for input(s): TSH, T4TOTAL, FREET4, T3FREE, THYROIDAB in the last 72 hours. Anemia Panel: No results for input(s): VITAMINB12, FOLATE, FERRITIN, TIBC, IRON, RETICCTPCT in the last 72 hours. Sepsis Labs: No results for input(s): PROCALCITON, LATICACIDVEN in the last 168 hours.  Recent Results (from the past 240 hour(s))  Urine culture     Status: None   Collection Time: 06/17/18 12:44 AM  Result Value Ref Range Status   Specimen Description URINE, CLEAN CATCH  Final   Special Requests   Final    NONE Performed at [facility], [address], [place], [place] [address]    Culture NO GROWTH  Final   Report Status 06/18/2018 FINAL  Final         Radiology Studies: No results found.      Scheduled Meds: . allopurinol  300 mg Oral BID  . amoxicillin-clavulanate  1 tablet Oral Q12H  . labetalol  300 mg Oral BID  . pantoprazole  40 mg Oral Q0600   Continuous Infusions:    LOS: 3 days    Time spent: 40 minutes    Ramiro Harvest, MD [facility] Pager [phone]  If 7PM-7AM, please contact night-coverage [email or web address] Password [email or web address] 06/19/2018, 6:11 PM

## 2018-06-19 NOTE — Progress Notes (Signed)
     [facility] Progress Note  CC:  Diverticular bleeding  Assessment / Plan: Presumed diverticular bleed with ? diverticulitis on CTA Acute GI blood loss anemia, now stable    - 3 units of PRBCs at the outside hospital prior to transfer Hospitalized for diverticular bleeding 09/2017 s/p Endosclip placement Severe left-sided diverticulosis on colonoscopy 10/08/17 Chronic Plavix use  Hemodynamically stable. No additional overt bleeding. Hemoglobin stable at 11.2.    Continue antibiotics for possible diverticulitis.  Resume Plavix tomorrow if it is clinically indicated and if there is no additional bleeding. Avoid all NSAIDs. Tagged bleeding scan for any overt GI bleeding that may occur.   GI will sign-off. Please call the on-call gastroenterologist with any additional questions or concerns during this hospitalization.  No formal outpatient GI follow-up needed.    Subjective: Reports a dark stool at midnight. Additional BM this morning had no blood. Tolerating a full liquid diet. Would like a diet advance. Requesting discharge. GI ROS is otherwise negative. No family was present at the time of my evaluation.   Objective:  Vital signs in last 24 hours: Temp:  [98 F (36.7 C)-98.7 F (37.1 C)] 98 F (36.7 C) (11/04 0417) Pulse Rate:  [60-70] 70 (11/04 0417) Resp:  [18-20] 20 (11/04 0417) BP: (94-164)/(57-78) 164/61 (11/04 0417) SpO2:  [97 %-99 %] 98 % (11/04 0417) Weight:  [85.5 kg] 85.5 kg (11/04 0413) Last BM Date: 06/19/18 General:   Alert, in NAD Heart:  Regular rate and rhythm; no murmurs Pulm: Clear anteriorly; no wheezing Abdomen:  Soft. Nontender. Nondistended. Normal bowel sounds. No rebound or guarding. LAD: No inguinal or umbilical LAD Extremities:  Without edema. Neurologic:  Alert and  oriented x4;  grossly normal neurologically. Psych:  Alert and cooperative. Normal mood and affect.  Intake/Output from previous day: 11/03 0701 - 11/04 0700 In:  1285 [P.O.:360; I.V.:650; IV Piggyback:275] Out: -  Intake/Output this shift: No intake/output data recorded.  Lab Results: Recent Labs    06/17/18 0412  06/18/18 0509 06/18/18 1556 06/19/18 0446  WBC 7.9  --  8.4  --  7.1  HGB 11.5*   < > 11.5* 11.2* 11.2*  HCT 34.2*   < > 35.2* 34.0* 33.5*  PLT 134*  --  144*  --  161   < > = values in this interval not displayed.   BMET Recent Labs    06/17/18 0412 06/18/18 0509 06/19/18 0446  NA 140 141 141  K 3.6 3.7 3.5  CL 110 111 112*  CO2 22 22 20*  GLUCOSE 88 84 96  BUN 22 16 12   CREATININE 1.03 1.04 1.05  CALCIUM 8.3* 8.5* 8.4*   LFT Recent Labs    06/16/18 1734  PROT 5.7*  ALBUMIN 3.2*  AST 23  ALT 15  ALKPHOS 68  BILITOT 1.4*   PT/INR Recent Labs    06/16/18 1734 06/17/18 0412  LABPROT 13.7 14.1  INR 1.06 1.10     LOS: 3 days   Tressia Danas  06/19/2018, 9:28 AM

## 2018-06-19 NOTE — Care Management Important Message (Signed)
Important Message  Patient Details  Name: Steven Dodson MRN: [id number] Date of Birth: 04/17/1932   Medicare Important Message Given:  Yes    Caren Macadam 06/19/2018, 12:00 PMImportant Message  Patient Details  Name: Steven Dodson MRN: [id number] Date of Birth: July 03, 1932   Medicare Important Message Given:  Yes    Caren Macadam 06/19/2018, 12:00 PM

## 2018-06-20 LAB — BASIC METABOLIC PANEL
Anion gap: 9 (ref 5–15)
BUN: 12 mg/dL (ref 8–23)
CHLORIDE: 109 mmol/L (ref 98–111)
CO2: 21 mmol/L — ABNORMAL LOW (ref 22–32)
CREATININE: 1.06 mg/dL (ref 0.61–1.24)
Calcium: 8.6 mg/dL — ABNORMAL LOW (ref 8.9–10.3)
GFR calc Af Amer: >60 mL/min (ref >60)
GFR calc non Af Amer: >60 mL/min (ref >60)
GLUCOSE: 104 mg/dL — AB (ref 70–99)
Potassium: 3.9 mmol/L (ref 3.5–5.1)
Sodium: 139 mmol/L (ref 135–145)

## 2018-06-20 LAB — GLUCOSE, CAPILLARY: GLUCOSE-CAPILLARY: 93 mg/dL (ref 70–99)

## 2018-06-20 LAB — HEMOGLOBIN AND HEMATOCRIT, BLOOD
HCT: 33.6 % — ABNORMAL LOW (ref 39.0–52.0)
Hemoglobin: 11 g/dL — ABNORMAL LOW (ref 13.0–17.0)

## 2018-06-20 MED ORDER — AMOXICILLIN-POT CLAVULANATE 875-125 MG PO TABS: 1.0000 | ORAL_TABLET | Freq: Two times a day (BID) | ORAL | 0 refills | Status: AC

## 2018-06-20 MED ORDER — PANTOPRAZOLE SODIUM 40 MG PO TBEC: 40.0000 mg | DELAYED_RELEASE_TABLET | Freq: Every day | ORAL | 0 refills | Status: DC

## 2018-06-20 MED ORDER — SIMETHICONE 80 MG PO CHEW: 80.0000 mg | CHEWABLE_TABLET | Freq: Four times a day (QID) | ORAL | 0 refills | Status: DC | PRN

## 2018-06-20 NOTE — Discharge Summary (Signed)
Physician Discharge Summary  [name]:[id number] DOB: 07/16/1932 DOA: 06/16/2018  PCP: [name], MD  Admit date: 06/16/2018 Discharge date: 06/20/2018  Time spent: 60 minutes  Recommendations for Outpatient Follow-up:  1. Follow-up with [name], MD in 1 to 2 weeks.  On follow-up patient will need a H&H done to follow-up on his hemoglobin.  Patient's Plavix was discontinued joint his hospitalization and will be deferred to PCP as to whether to resume if indicated clinically.  Per GI if Plavix is clinically indicated with no further additional bleeding it may be resumed.   Discharge Diagnoses:  Principal Problem:   Acute GI bleeding Active Problems:   Acute blood loss anemia   Essential hypertension   Gout   HLD (hyperlipidemia)   Hematochezia   Diverticulosis of colon with hemorrhage   GI bleed   Diverticulitis   Syncope   Hyperlipidemia   Discharge Condition: Stable and improved.  Diet recommendation: Regular/low fiber diet  Filed Weights   06/18/18 0605 06/19/18 0413 06/20/18 0517  Weight: 84.6 kg 85.5 kg 86 kg    History of present illness:  Steven Dodson is a 84 y.o. male with medical history significant of gout on allopurinol and indomethacin as needed, hypertension, hyperlipidemia, history of diverticulosis, gastroesophageal reflux disease, glaucoma, prior hospitalization for GI bleed felt secondary to a diverticular source status post clipping( 10/06/2017-10/09/2017), on chronic Plavix who was admitted to [facility] on 06/16/2018 for acute GI bleed.  Patient presented at [facility] where noted to have some bloody bowel movements.  CT angiogram which was done was consistent with a sigmoid diverticulitis.  Initial hemoglobin was 13.5 in the ED.  During that hospitalization patient was noted to have a large bowel movement and subsequently a syncopal episode with marketed hypotension and hypovolemia.  Patient was placed in the supine position  placed and IV fluids and mental status markedly improved.  Repeat hemoglobin done was approximately 10.  Patient transfused 3 units packed red blood cells and patient transferred to [facility] for further evaluation by GI as round of hospital had no GI coverage this weekend. Patient stated the day prior to admission around 4:12 PM had a bloody bowel movement not sure of the color but initially felt it was dark.  Patient stated he lives in a retirement community and around 10:30 PM the day prior to admission had some more bloody bowel movements called for help however did not get any help and subsequently called his son at 1:30 AM on the day of admission.  Patient's son stated when he got there he noted that patient had bloody bowel movement which was bright red and called the ambulance and patient subsequently transferred to [facility].  Patient did endorse some sharp pain in the left lower quadrant intermittent in nature and associated with his bloody bowel movements.  Patient stated had about 5 bloody bowel movements prior to presentation to the ED.  Patient denied any fevers, no nausea, no vomiting, no dysuria, no constipation, no no headache, no chest pain, no shortness of breath, no change in visual symptoms.  Patient did endorse some chills, some abdominal pain, some diarrhea.  No other associated symptoms. During the interview patient noted to have a bloody bowel movement which was melanotic in nature with some surrounding bright red blood  Hospital Course:  1 acute GI bleed likely diverticular bleed Patient presented with an acute GI bleed. Concerning for acute diverticular bleed. CT angiogram was done at outside  hospital which did show a sigmoid diverticulitis, advanced atherosclerosis calcification of aorta, 2.8 cm infrarenal aortic ectasia, cholelithiasis, no abscess or evidence of perforation. Patient noted to have a prior lower GI bleed in February 2019 felt to be diverticular  in nature status post clipping. Patient also noted to be on indomethacin as needed as well as Aleve as needed with a history of gout. At outside hospital hemoglobin dropped from 13.5-10 after large bloody bowel movement with an associated syncopal episode. Patient status post transfusion of 3 units packed red blood cells.  Hemoglobin posttransfusion was 13.0.  Hemoglobin stabilized at 11.0.  Patient noted to have small bloody bowel movements in the form of clots which slowly improved during the hospitalization.  Amount of bloody bowel movements decreased.  GI was consulted to follow the patient throughout the hospitalization.  Patient's Plavix was held and will not be resumed on discharge and will be deferred to PCP as they went to resume.  Patient was also placed on IV PPI and subsequently transition to oral Protonix.  Patient was started on a clear liquid diet diet advanced and was tolerating a soft diet by day of discharge.  Patient be discharged in stable and improved condition is to follow-up with PCP in the outpatient setting.   2. Acute blood loss anemia Secondary to problem #1.   Patient status post 3 units packed red blood cells at outside hospital prior to admission.  Hemoglobin on presentation posttransfusion was 13.  Hemoglobin stabilized at 11 by day of discharge.  Patient's GI bleed slowed down improved during the hospitalization and had essentially resolved by day of discharge.  Patient was followed by gastroenterology throughout this hospitalization.  Outpatient follow-up with PCP.   3. Acute diverticulitis Per CT angiogram abdomen and pelvis done at outside hospital.   Patient was placed empirically on IV Zosyn.  As patient improved clinically with transition to oral Augmentin.  Patient be discharged home on 4 more days of oral Augmentin to complete a course of antibiotic treatment.  Outpatient follow-up with PCP.   4. Syncopal episode Likely secondary to problem #1. Patient had  a large bloody bowel movement prior to syncopal episode at outside hospital. Patient was noted to regain consciousness with clinical improvement with IV fluids and subsequent improvement with his hypotension.  No further syncopal episodes.  5. Hyperlipidemia Statin was held during the hospitalization.  Resumed on discharge.    6. Hypertension Antihypertensive blood pressure medications were held on admission secondary to problem #1.  Labetalol resumed.  Patient's other antihypertensive medications will be resumed on discharge. Follow.   7. History of gout Patient maintained on allopurinol.     Procedures:  None  Consultations:  Gastroenterology: Dr. Adela Lank 06/16/2018  Discharge Exam: Vitals:   06/19/18 2101 06/20/18 0517  BP: (!) 159/67 135/84  Pulse: 60 (!) 57  Resp: 16 20  Temp: 97.9 F (36.6 C) 97.9 F (36.6 C)  SpO2: 99% 95%    General: NAD Cardiovascular: RRR Respiratory: CTAB  Discharge Instructions   Discharge Instructions    Diet general   Complete by:  As directed    low fiber diet   Discharge instructions   Complete by:  As directed    Avoid all NSAIDs.   Increase activity slowly   Complete by:  As directed      Allergies as of 06/20/2018   No Known Allergies     Medication List    STOP taking these medications   clopidogrel 75  MG tablet Commonly known as:  PLAVIX   indomethacin 50 MG capsule Commonly known as:  INDOCIN   naproxen sodium 220 MG tablet Commonly known as:  ALEVE     TAKE these medications   allopurinol 300 MG tablet Commonly known as:  ZYLOPRIM Take 300 mg by mouth daily.   amoxicillin-clavulanate 875-125 MG tablet Commonly known as:  AUGMENTIN Take 1 tablet by mouth every 12 (twelve) hours for 4 days.   brimonidine 0.2 % ophthalmic solution Commonly known as:  ALPHAGAN Place 1 drop into both eyes 2 (two) times daily with breakfast and lunch.   dorzolamide-timolol 22.3-6.8 MG/ML ophthalmic  solution Commonly known as:  COSOPT Place 1 drop into the right eye 2 (two) times daily with breakfast and lunch. With breakfast and lunch   labetalol 300 MG tablet Commonly known as:  NORMODYNE Take 300 mg by mouth 2 (two) times daily.   latanoprost 0.005 % ophthalmic solution Commonly known as:  XALATAN Place 1 drop into both eyes at bedtime.   lisinopril-hydrochlorothiazide 20-25 MG tablet Commonly known as:  PRINZIDE,ZESTORETIC Take 1 tablet by mouth daily.   multivitamin with minerals Tabs tablet Take 1 tablet by mouth daily.   pantoprazole 40 MG tablet Commonly known as:  PROTONIX Take 1 tablet (40 mg total) by mouth daily at 6 (six) AM. Start taking on:  06/21/2018   pravastatin 40 MG tablet Commonly known as:  PRAVACHOL Take 40 mg by mouth at bedtime.   simethicone 80 MG chewable tablet Commonly known as:  MYLICON Chew 1 tablet (80 mg total) by mouth 4 (four) times daily as needed for flatulence.      No Known Allergies Follow-up Information    Dough, Doris Cheadle, MD. Schedule an appointment as soon as possible for a visit in 1 week(s).   Specialty:  Family Medicine Why:  f/u in 1-2 weeks. Contact information: [address] [place] [address] [phone]            The results of significant diagnostics from this hospitalization (including imaging, microbiology, ancillary and laboratory) are listed below for reference.    Significant Diagnostic Studies: No results found.  Microbiology: Recent Results (from the past 240 hour(s))  Urine culture     Status: None   Collection Time: 06/17/18 12:44 AM  Result Value Ref Range Status   Specimen Description URINE, CLEAN CATCH  Final   Special Requests   Final    NONE Performed at [facility], [address], [place], [place] [address]    Culture NO GROWTH  Final   Report Status 06/18/2018 FINAL  Final     Labs: Basic Metabolic Panel: Recent Labs  Lab 06/16/18 1734  06/17/18 0412 06/18/18 0509 06/19/18 0446 06/20/18 0451  NA 137 140 141 141 139  K 4.0 3.6 3.7 3.5 3.9  CL 108 110 111 112* 109  CO2 22 22 22  20* 21*  GLUCOSE 108* 88 84 96 104*  BUN 26* 22 16 12 12   CREATININE 0.91 1.03 1.04 1.05 1.06  CALCIUM 8.5* 8.3* 8.5* 8.4* 8.6*  MG 2.0  --  2.1  --   --    Liver Function Tests: Recent Labs  Lab 06/16/18 1734  AST 23  ALT 15  ALKPHOS 68  BILITOT 1.4*  PROT 5.7*  ALBUMIN 3.2*   No results for input(s): LIPASE, AMYLASE in the last 168 hours. No results for input(s): AMMONIA in the last 168 hours. CBC: Recent Labs  Lab 06/16/18  1734 06/17/18 0412 06/17/18 1811 06/18/18 0509 06/18/18 1556 06/19/18 0446 06/20/18 0451  WBC 12.0* 7.9  --  8.4  --  7.1  --   NEUTROABS 9.7*  --   --  6.2  --   --   --   HGB 13.0 11.5* 11.8* 11.5* 11.2* 11.2* 11.0*  HCT 39.0 34.2* 34.3* 35.2* 34.0* 33.5* 33.6*  MCV 94.2 93.2  --  94.4  --  93.6  --   PLT 157 134*  --  144*  --  161  --    Cardiac Enzymes: No results for input(s): CKTOTAL, CKMB, CKMBINDEX, TROPONINI in the last 168 hours. BNP: BNP (last 3 results) No results for input(s): BNP in the last 8760 hours.  ProBNP (last 3 results) No results for input(s): PROBNP in the last 8760 hours.  CBG: Recent Labs  Lab 06/17/18 0747 06/18/18 0755 06/19/18 0734 06/20/18 0803  GLUCAP 83 83 82 93       Signed:  Ramiro Harvest MD.  [facility] 06/20/2018, 2:22 PM

## 2018-09-26 DIAGNOSIS — H8113 Benign paroxysmal vertigo, bilateral: Secondary | ICD-10-CM | POA: Insufficient documentation

## 2018-09-26 HISTORY — DX: Benign paroxysmal vertigo, bilateral: H81.13

## 2019-06-08 DIAGNOSIS — L989 Disorder of the skin and subcutaneous tissue, unspecified: Secondary | ICD-10-CM | POA: Insufficient documentation

## 2019-06-08 DIAGNOSIS — B354 Tinea corporis: Secondary | ICD-10-CM | POA: Insufficient documentation

## 2019-06-08 HISTORY — DX: Tinea corporis: B35.4

## 2019-06-08 HISTORY — DX: Disorder of the skin and subcutaneous tissue, unspecified: L98.9

## 2019-08-03 DIAGNOSIS — K645 Perianal venous thrombosis: Secondary | ICD-10-CM

## 2019-08-03 HISTORY — DX: Perianal venous thrombosis: K64.5

## 2019-12-15 IMAGING — CT CT CTA ABD/PEL W/CM AND/OR W/O CM
3 of 9 series · 11 of 46 positions shown, 17 images · IV contrast (Omni 300)
Comparison: None.

CLINICAL DATA: GI bleeding, suspected lower source

EXAM:
CTA ABDOMEN AND PELVIS WITH CONTRAST
TECHNIQUE: Multidetector CT imaging of the abdomen and pelvis was performed
using the standard protocol during bolus administration of
intravenous contrast. Multiplanar reconstructed images and MIPs were
obtained and reviewed to evaluate the vascular anatomy. No
precontrast imaging.
CONTRAST:  100mL [id number] IOPAMIDOL ([id number]) INJECTION 76%

[Series 5: renal cta · axial · 0.98mm/px · z∈[+695,+770]mm · 2 of 174 slices shown]
[im 13/174  soft-tissue]
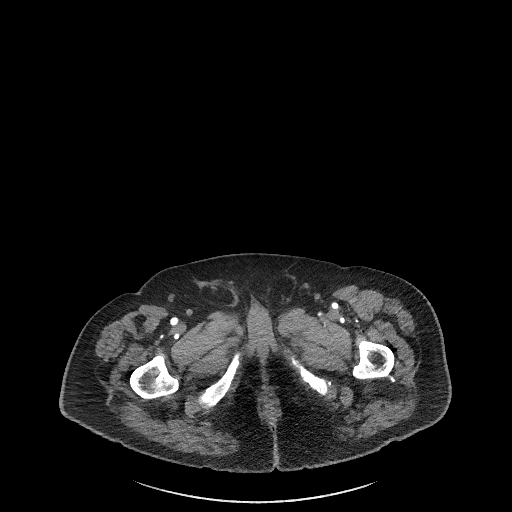
[im 38/174  soft-tissue]
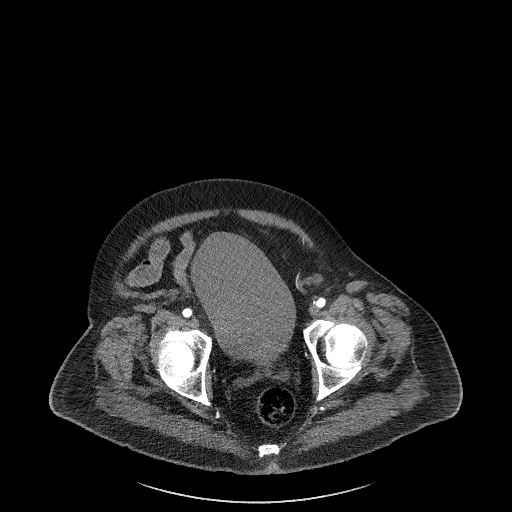

[Series 7: renal cta cor · coronal · 0.88mm/px · 2 of 184 slices shown, 3 images]
[im 62/184  soft-tissue]
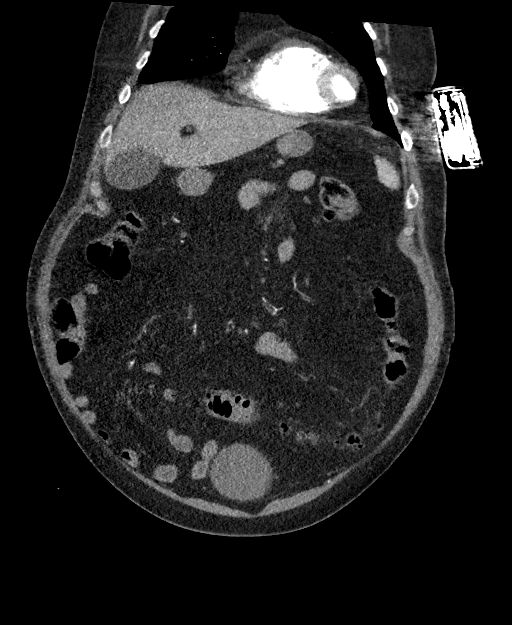
[im 62/184  bone]
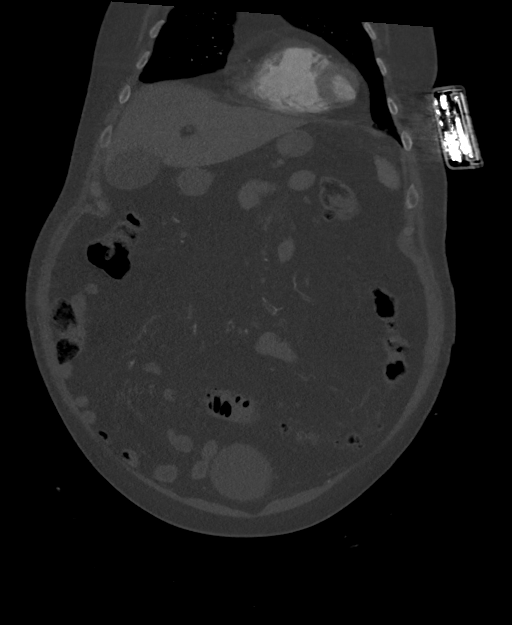
[im 123/184  soft-tissue]
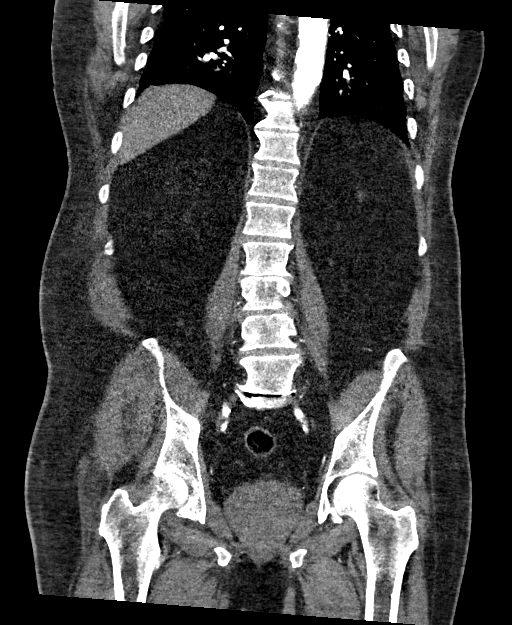

[Series 11: venous 5.0 · axial · portal-venous · 0.98mm/px · z∈[+723,+1113]mm · 7 of 105 slices shown, 12 images]
[im 14/105  soft-tissue]
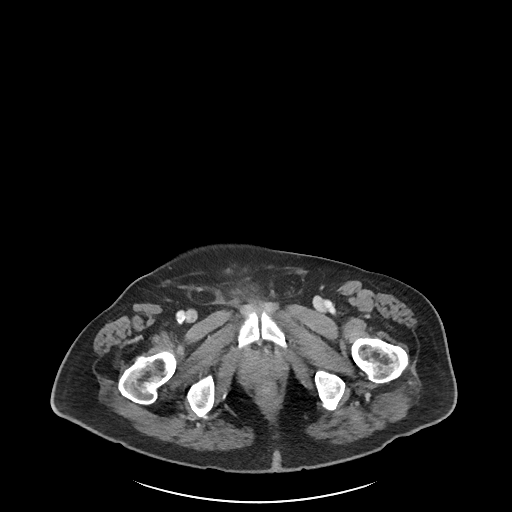
[im 14/105  bone]
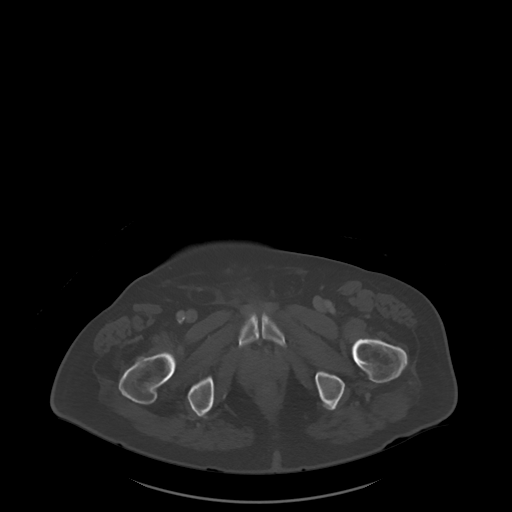
[im 27/105  soft-tissue]
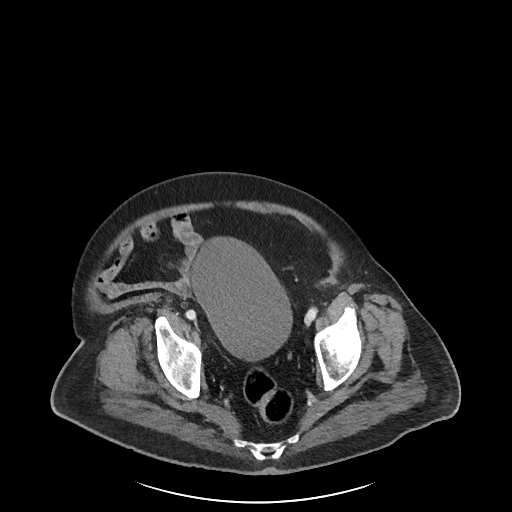
[im 40/105  soft-tissue]
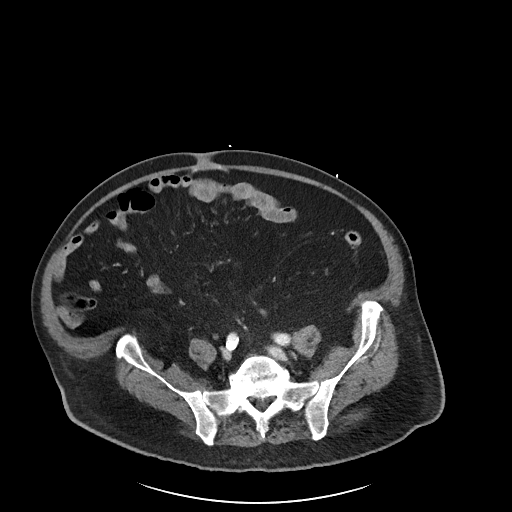
[im 53/105  soft-tissue]
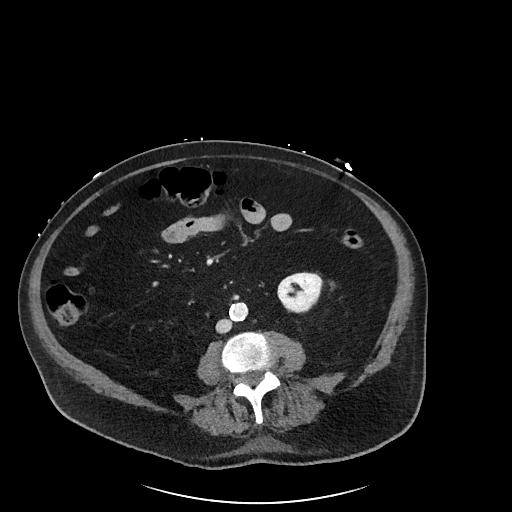
[im 53/105  lung]
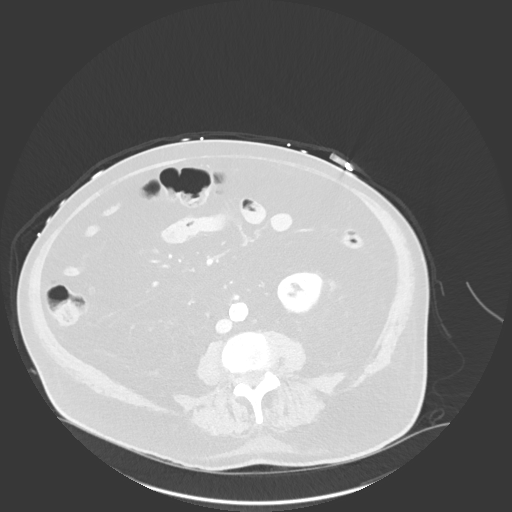
[im 66/105  soft-tissue]
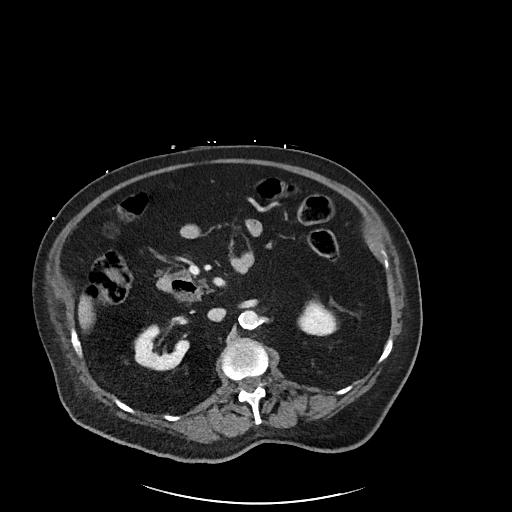
[im 66/105  lung]
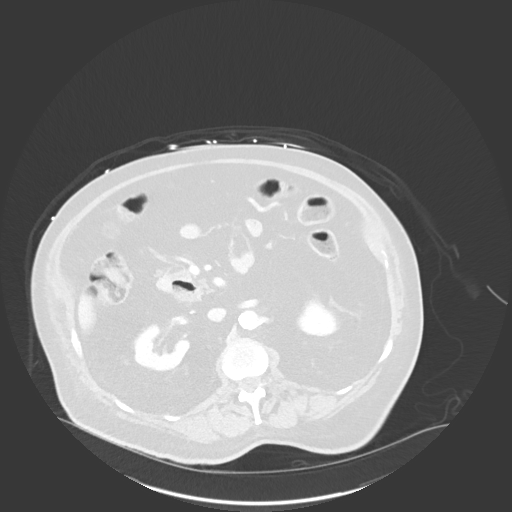
[im 79/105  soft-tissue]
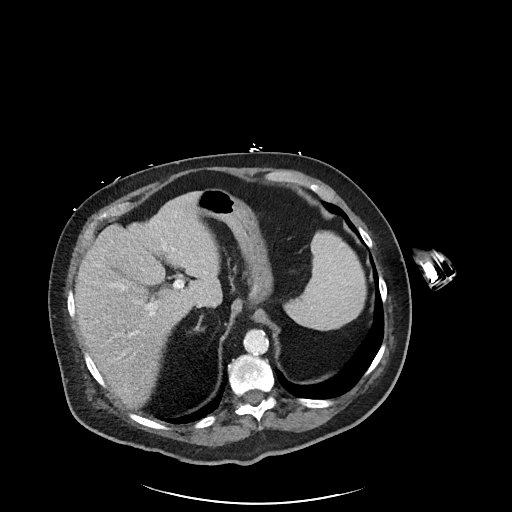
[im 79/105  lung]
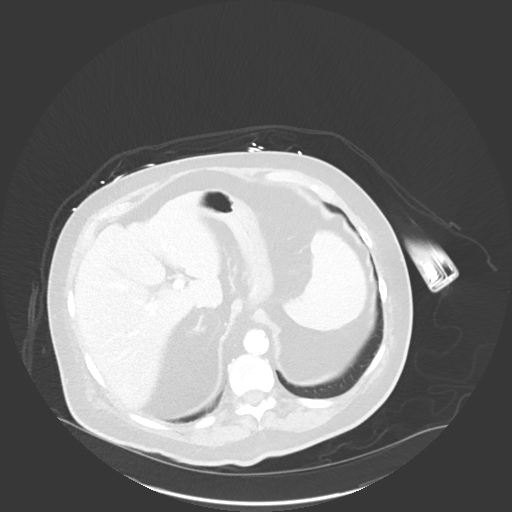
[im 92/105  soft-tissue]
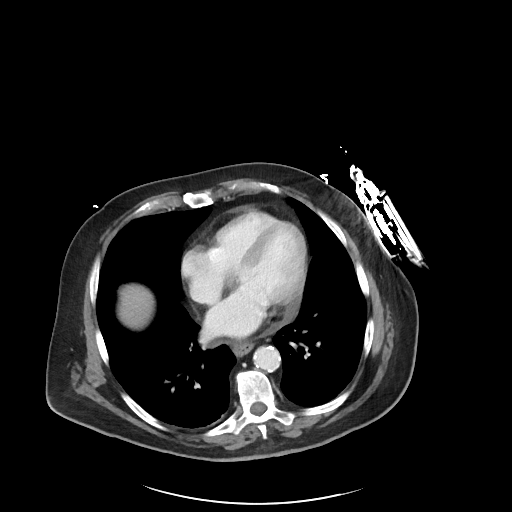
[im 92/105  lung]
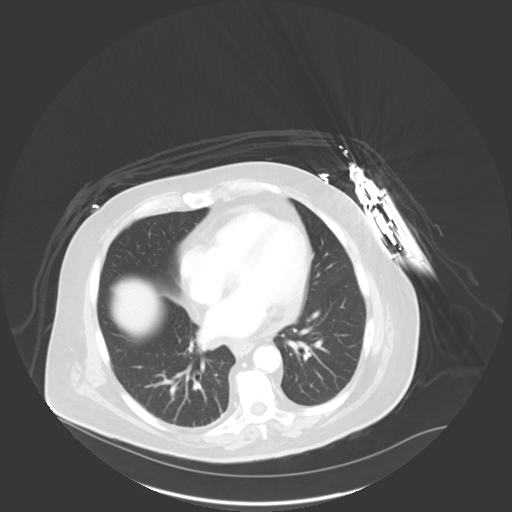

[11 of 46 positions shown; findings below may reference images not displayed]

FINDINGS: VASCULAR

Moderate coronary calcifications.

Aorta: Moderate calcified atheromatous plaque. Fusiform 2.9 cm
infrarenal segment associated with a posterior penetrating
atheromatous ulcer, with some eccentric nonocclusive mural thrombus.
Smaller penetrating atheromatous ulcer along the left lateral wall
of the distal abdominal aorta. No associated intramural hematoma. No
dissection. No significant stenosis.

Celiac: Patent without evidence of aneurysm, dissection, vasculitis
or significant stenosis.

SMA: Patent without evidence of aneurysm, dissection, vasculitis or
significant stenosis.

Renals: Duplicated bilaterally, superior moieties dominant.
Bilateral partially calcified ostial plaque, no high-grade stenosis.

IMA: Patent without evidence of aneurysm, dissection, vasculitis or
significant stenosis.

Inflow: Calcified nonocclusive plaque in bilateral common iliac
arteries. No aneurysm or dissection.

Proximal Outflow: Minimal plaque. No aneurysm, dissection, or
stenosis.

Veins: Patent hepatic veins, portal vein, SMV, splenic vein,
bilateral renal veins, IVC. The iliac venous system is unremarkable,
incompletely distended.

Review of the MIP images confirms the above findings.

NON-VASCULAR

Lower chest: No acute abnormality.

Hepatobiliary: Partially calcified subcentimeter stones in the
dependent aspect of the nondilated gallbladder. No focal liver
lesion or biliary ductal dilatation.

Pancreas: Moderate diffuse atrophy without mass or ductal
dilatation.

Spleen: Normal in size without focal abnormality.

Adrenals/Urinary Tract: Adrenal glands are unremarkable. Kidneys are
normal, without renal calculi, focal lesion, or hydronephrosis.
Bladder is distended.

Stomach/Bowel: Small hiatal hernia. Stomach is decompressed.
Duodenal diverticulum. Small bowel decompressed. Normal appendix.
Colon is nondilated. Multiple distal descending and sigmoid
diverticula. Subtle increased density in the lumen of the proximal
sigmoid colon best appreciated on the coronal reconstructions,
although no definite arterial extravasation is noted on early phase
imaging. No evidence of vascular lesion.

Lymphatic: No abdominal or pelvic adenopathy.

Reproductive: Marked prostatic enlargement.

Other: No ascites.  No free air.

Musculoskeletal: Degenerative disc disease L5-S1. Negative for
fracture or worrisome bone abnormality.
IMPRESSION: VASCULAR

1. Subtle increasing intraluminal density in the proximal sigmoid
colon suggestive of source of GI bleed, although no definite
arterial extravasation is noted on serial phase imaging.
2. Penetrating atheromatous ulcers in the distal abdominal aorta,
ectatic and at risk for aneurysm development. Recommend followup by
ultrasound in 5 years. This recommendation follows ACR consensus
guidelines: White Paper of the ACR Incidental Findings Committee II
on Vascular Findings. [HOSPITAL] 3957; [DATE].

NON-VASCULAR

1. Marked prostatic enlargement with distention of the urinary
bladder.
2. Cholelithiasis.
3. Small hiatal hernia.

## 2020-06-23 DIAGNOSIS — N476 Balanoposthitis: Secondary | ICD-10-CM

## 2020-06-23 HISTORY — DX: Balanoposthitis: N47.6

## 2020-08-05 DIAGNOSIS — G4700 Insomnia, unspecified: Secondary | ICD-10-CM

## 2020-08-05 HISTORY — DX: Insomnia, unspecified: G47.00

## 2020-10-08 DIAGNOSIS — N401 Enlarged prostate with lower urinary tract symptoms: Secondary | ICD-10-CM | POA: Insufficient documentation

## 2020-10-08 HISTORY — DX: Benign prostatic hyperplasia with lower urinary tract symptoms: N40.1

## 2021-01-26 DIAGNOSIS — R39198 Other difficulties with micturition: Secondary | ICD-10-CM

## 2021-01-26 HISTORY — DX: Other difficulties with micturition: R39.198

## 2021-02-04 DIAGNOSIS — R059 Cough, unspecified: Secondary | ICD-10-CM

## 2021-02-04 HISTORY — DX: Cough, unspecified: R05.9

## 2021-02-27 DIAGNOSIS — Z9181 History of falling: Secondary | ICD-10-CM

## 2021-02-27 HISTORY — DX: History of falling: Z91.81

## 2021-05-06 ENCOUNTER — Emergency Department (HOSPITAL_COMMUNITY): Payer: Medicare HMO

## 2021-05-06 ENCOUNTER — Emergency Department (HOSPITAL_COMMUNITY)
Admission: EM | Admit: 2021-05-06 | Discharge: 2021-05-06 | Disposition: A | Discharge status: Home / Self Care | Payer: Medicare HMO | Attending: Emergency Medicine | Admitting: Emergency Medicine

## 2021-05-06 DIAGNOSIS — I10 Essential (primary) hypertension: Secondary | ICD-10-CM | POA: Diagnosis not present

## 2021-05-06 DIAGNOSIS — R9431 Abnormal electrocardiogram [ECG] [EKG]: Secondary | ICD-10-CM | POA: Insufficient documentation

## 2021-05-06 DIAGNOSIS — R531 Weakness: Secondary | ICD-10-CM | POA: Diagnosis not present

## 2021-05-06 DIAGNOSIS — I498 Other specified cardiac arrhythmias: Secondary | ICD-10-CM | POA: Insufficient documentation

## 2021-05-06 DIAGNOSIS — Z79899 Other long term (current) drug therapy: Secondary | ICD-10-CM | POA: Diagnosis not present

## 2021-05-06 DIAGNOSIS — R5383 Other fatigue: Secondary | ICD-10-CM | POA: Insufficient documentation

## 2021-05-06 DIAGNOSIS — Z20822 Contact with and (suspected) exposure to covid-19: Secondary | ICD-10-CM | POA: Diagnosis not present

## 2021-05-06 DIAGNOSIS — R0602 Shortness of breath: Secondary | ICD-10-CM | POA: Diagnosis present

## 2021-05-06 LAB — CBC
HCT: 39.6 % (ref 39.0–52.0)
Hemoglobin: 13.5 g/dL (ref 13.0–17.0)
MCH: 32.9 pg (ref 26.0–34.0)
MCHC: 34.1 g/dL (ref 30.0–36.0)
MCV: 96.6 fL (ref 80.0–100.0)
Platelets: 157 10*3/uL (ref 150–400)
RBC: 4.1 MIL/uL — ABNORMAL LOW (ref 4.22–5.81)
RDW: 13.3 % (ref 11.5–15.5)
WBC: 8.8 10*3/uL (ref 4.0–10.5)
nRBC: 0 % (ref 0.0–0.2)

## 2021-05-06 LAB — BASIC METABOLIC PANEL
Anion gap: 10 (ref 5–15)
BUN: 21 mg/dL (ref 8–23)
CO2: 24 mmol/L (ref 22–32)
Calcium: 9.4 mg/dL (ref 8.9–10.3)
Chloride: 104 mmol/L (ref 98–111)
Creatinine, Ser: 1.23 mg/dL (ref 0.61–1.24)
GFR, Estimated: 57 mL/min — ABNORMAL LOW (ref >60)
Glucose, Bld: 125 mg/dL — ABNORMAL HIGH (ref 70–99)
Potassium: 4.1 mmol/L (ref 3.5–5.1)
Sodium: 138 mmol/L (ref 135–145)

## 2021-05-06 LAB — TROPONIN I (HIGH SENSITIVITY)
Troponin I (High Sensitivity): 10 ng/L (ref <18)
Troponin I (High Sensitivity): 9 ng/L (ref <18)

## 2021-05-06 LAB — SARS CORONAVIRUS 2 (TAT 6-24 HRS): SARS Coronavirus 2: NEGATIVE

## 2021-05-06 MED ORDER — LABETALOL HCL 100 MG PO TABS: 100.0000 mg | ORAL_TABLET | Freq: Two times a day (BID) | ORAL | 0 refills | Status: DC

## 2021-05-06 MED ORDER — AMLODIPINE BESYLATE 5 MG PO TABS
5.0000 mg | ORAL_TABLET | Freq: Once | ORAL | Status: AC
  Administered 2021-05-06: 5 mg via ORAL
  Filled 2021-05-06: qty 1

## 2021-05-06 MED ORDER — AMLODIPINE BESYLATE 5 MG PO TABS: 5.0000 mg | ORAL_TABLET | Freq: Every day | ORAL | 0 refills | Status: DC

## 2021-05-06 NOTE — ED Triage Notes (Signed)
Pt reports weakness for about the last week. Pt reports some increased SOB but speaking in full sentences. VSS. Seen by PMD today had an abnormal EKG. NAD at present.

## 2021-05-06 NOTE — Discharge Instructions (Addendum)
You were seen in the Emergency Department today due to abnormal EKG and generalized weakness. Your blood work and chest x-ray were normal, but your EKG shows something called "bigeminy" which is a form of irregular heart rate. You should follow up with a cardiologist  I have asked the cardiology team to follow-up with you, they should be sending you a cardiac monitor in the mail and having you follow-up in the office.  Please note the following changes to the medications  Please stop taking labetalol 300 mg tablets and change to 100 mg tablets.  This should be taken twice a day  Please start taking amlodipine 5 mg by mouth daily  I would like for you to return to the emergency department for any severe or worsening symptoms

## 2021-05-06 NOTE — ED Notes (Signed)
Pt discharged to home. Discharge instructions have been discussed with patient and/or family members. Pt verbally acknowledges understanding d/c instructions, and endorses comprehension to checkout at registration before leaving.  °

## 2021-05-06 NOTE — ED Provider Notes (Signed)
[facility] EMERGENCY DEPARTMENT Provider Note   CSN: [id number] Arrival date & time: 05/06/21  1003     History Chief Complaint  Patient presents with   Weakness   Fall   Dizziness    Steven Dodson is an 87 y.o. male with PMHx of HTN, HLD, GI bleed, glaucoma, and gout who presents from PCP office for abnormal EKG.  Patient was seeing his PCP today due to 2 week history of generalized weakness. PCP obtained EKG which was abnormal and recommended ED evaluation.  Here patient endorses 2 weeks of generalized weakness and fatigue. Reports associated shortness of breath and dizziness. The shortness of breath is "slight" per patient. The dizziness has been present for 3-4 months and he thinks may be related to his glaucoma. Denies falls, headache, nausea, vomiting, abdominal pain. No chest pain, palpitations, leg swelling, or syncope. No fever, chills, or cough.     Past Medical History:  Diagnosis Date   Arthritis    Glaucoma    Gout    Hyperlipidemia    Hypertension     Patient Active Problem List   Diagnosis Date Noted   GI bleed 06/16/2018   Diverticulitis 06/16/2018   Syncope 06/16/2018   Hyperlipidemia    Hematochezia    Diverticulosis of colon with hemorrhage    Acute GI bleeding 10/07/2017   Acute blood loss anemia 10/07/2017   Essential hypertension 10/07/2017   Gout 10/07/2017   HLD (hyperlipidemia) 10/07/2017    Past Surgical History:  Procedure Laterality Date   "navel base repair" per pt     COLONOSCOPY N/A 10/08/2017   Procedure: COLONOSCOPY;  Surgeon: Napoleon Form, MD;  Location: [facility] ENDOSCOPY;  Service: Endoscopy;  Laterality: N/A;   ESOPHAGOGASTRODUODENOSCOPY N/A 10/07/2017   Procedure: ESOPHAGOGASTRODUODENOSCOPY (EGD);  Surgeon: Sherrilyn Rist, MD;  Location: [facility] ENDOSCOPY;  Service: Gastroenterology;  Laterality: N/A;   HERNIA REPAIR         Family History  Problem Relation Age of Onset   Heart failure Mother    CAD  Father    Colon cancer Neg Hx     Social History   Tobacco Use   Smoking status: Never   Smokeless tobacco: Never  Vaping Use   Vaping Use: Never used  Substance Use Topics   Alcohol use: No   Drug use: No    Home Medications Prior to Admission medications   Medication Sig Start Date End Date Taking? Authorizing Provider  allopurinol (ZYLOPRIM) 300 MG tablet Take 300 mg by mouth daily.     [provider]  brimonidine (ALPHAGAN) 0.2 % ophthalmic solution Place 1 drop into both eyes 2 (two) times daily with breakfast and lunch.    [provider]  dorzolamide-timolol (COSOPT) 22.3-6.8 MG/ML ophthalmic solution Place 1 drop into the right eye 2 (two) times daily with breakfast and lunch. With breakfast and lunch     [provider]  labetalol (NORMODYNE) 300 MG tablet Take 300 mg by mouth 2 (two) times daily.    [provider]  latanoprost (XALATAN) 0.005 % ophthalmic solution Place 1 drop into both eyes at bedtime.    [provider]  lisinopril-hydrochlorothiazide (PRINZIDE,ZESTORETIC) 20-25 MG tablet Take 1 tablet by mouth daily.    [provider]  Multiple Vitamin (MULTIVITAMIN WITH MINERALS) TABS tablet Take 1 tablet by mouth daily.    [provider]  pantoprazole (PROTONIX) 40 MG tablet Take 1 tablet (40 mg total) by mouth daily at  6 (six) AM. 06/21/18   Rodolph Bong, MD  pravastatin (PRAVACHOL) 40 MG tablet Take 40 mg by mouth at bedtime.    [provider]  simethicone (MYLICON) 80 MG chewable tablet Chew 1 tablet (80 mg total) by mouth 4 (four) times daily as needed for flatulence. 06/20/18   Rodolph Bong, MD    Allergies    Patient has no known allergies.  Review of Systems   Review of Systems  Constitutional:  Positive for fatigue. Negative for chills and fever.  Respiratory:  Positive for shortness of breath. Negative for cough.   Cardiovascular:  Negative for chest pain, palpitations  and leg swelling.  Gastrointestinal:  Negative for abdominal pain, diarrhea and vomiting.  Genitourinary:  Negative for difficulty urinating.  Musculoskeletal:  Negative for back pain.  Neurological:  Positive for dizziness and weakness.  Psychiatric/Behavioral:  Negative for confusion.    Physical Exam Updated Vital Signs BP (!) 156/58   Pulse (!) 56   Temp 98.1 F (36.7 C)   Resp 18   Ht 5\' 7"  (1.702 m)   Wt 85.7 kg   SpO2 98%   BMI 29.60 kg/m   Physical Exam Constitutional:      General: He is not in acute distress.    Appearance: Normal appearance. He is not toxic-appearing.  HENT:     Head: Normocephalic and atraumatic.     Mouth/Throat:     Mouth: Mucous membranes are moist.     Pharynx: Oropharynx is clear.  Eyes:     Extraocular Movements: Extraocular movements intact.     Pupils: Pupils are equal, round, and reactive to light.  Cardiovascular:     Rate and Rhythm: Normal rate and regular rhythm.     Heart sounds: Normal heart sounds.  Pulmonary:     Effort: Pulmonary effort is normal.     Breath sounds: Normal breath sounds.  Abdominal:     Palpations: Abdomen is soft.     Tenderness: There is no abdominal tenderness.  Musculoskeletal:        General: Normal range of motion.     Cervical back: Neck supple.  Lymphadenopathy:     Cervical: No cervical adenopathy.  Skin:    General: Skin is warm and dry.     Capillary Refill: Capillary refill takes less than 2 seconds.  Neurological:     General: No focal deficit present.     Mental Status: He is alert. Mental status is at baseline.     Sensory: No sensory deficit.     Motor: No weakness.    ED Results / Procedures / Treatments   Labs (all labs ordered are listed, but only abnormal results are displayed) Labs Reviewed  BASIC METABOLIC PANEL - Abnormal; Notable for the following components:      Result Value   Glucose, Bld 125 (*)    GFR, Estimated 57 (*)    All other components within normal  limits  CBC - Abnormal; Notable for the following components:   RBC 4.10 (*)    All other components within normal limits  SARS CORONAVIRUS 2 (TAT 6-24 HRS)  TROPONIN I (HIGH SENSITIVITY)  TROPONIN I (HIGH SENSITIVITY)    EKG EKG Interpretation  Date/Time:  Wednesday May 06 2021 10:21:37 EDT Ventricular Rate:  65 PR Interval:  214 QRS Duration: 140 QT Interval:  470 QTC Calculation: 488 R Axis:   -48 Text Interpretation: Sinus rhythm with 1st degree A-V block with frequent Premature ventricular  complexes in a pattern of bigeminy Left axis deviation Right bundle branch block Abnormal ECG Confirmed by Gerhard Munch [id number]) on 05/06/2021 2:00:54 PM  Radiology DG Chest 1 View  Result Date: 05/06/2021 CLINICAL DATA:  87 year old male with weakness, fatigue for 2 weeks. EXAM: CHEST  1 VIEW COMPARISON:  Chest radiographs 11/05/2020 and earlier. FINDINGS: Upright AP view at 1042 hours. Stable somewhat low lung volumes. Calcified aortic atherosclerosis. Other mediastinal contours are within normal limits. Visualized tracheal air column is within normal limits. No pneumothorax, pulmonary edema, pleural effusion or confluent pulmonary opacity. Paucity of bowel gas in the upper abdomen. No acute osseous abnormality identified. IMPRESSION: No acute cardiopulmonary abnormality. Electronically Signed   By: Odessa Fleming M.D.   On: 05/06/2021 11:13    Procedures Procedures   Medications Ordered in ED Medications - No data to display  ED Course  I have reviewed the triage vital signs and the nursing notes.  Pertinent labs & imaging results that were available during my care of the patient were reviewed by me and considered in my medical decision making (see chart for details).    MDM Rules/Calculators/A&P                         This is an 87 year old male with PMHx of HTN, HLD, GI bleed, glaucoma, and gout presenting from PCP office for abnormal EKG.  Per PCP office note, EKG in the office  showed "1st degree AV block with ventricular escape". EKG on arrival to the ED demonstrates bigeminy. Basic cardiac workup including CBC, BMP, CXR and troponin obtained and were unremarkable.  With regard to his 2 weeks of generalized weakness, differential is quite broad and includes infectious etiology, electrolyte abnormality, thyroid disease, deconditioning, or symptomatic arrhythmia. Given his symptoms are subacute, he has normal vitals, normal physical exam and unremarkable CBC/BMP/CXR, do not feel additional workup needed here in the ED.   Upon re-evaluation, patient in normal sinus rhythm. Will discuss case with on-call cardiology provider. Anticipate discharge home with outpatient EP and PCP follow up.    Final Clinical Impression(s) / ED Diagnoses Final diagnoses:  Bigeminy  Generalized weakness    Rx / DC Orders ED Discharge Orders     None        Maury Dus, MD 05/06/21 1630    Gerhard Munch, MD 05/08/21 2354

## 2021-05-06 NOTE — ED Notes (Signed)
Pt on cardiac monitor. Family at bedside

## 2021-05-06 NOTE — ED Notes (Signed)
ED Provider at bedside. 

## 2021-05-06 NOTE — ED Provider Notes (Signed)
Emergency Medicine Provider Triage Evaluation Note  Steven Dodson , a 87 y.o. male  was evaluated in triage.  Pt complains of fatigue.  Review of Systems  Positive: Weakness, fatigue, chills Negative: Fever, cough, cold sxs, cp, active paind  Physical Exam  BP (!) 162/55 (BP Location: Left Arm)   Pulse (!) 50   Temp 98.1 F (36.7 C)   Resp 15   Ht 5\' 7"  (1.702 m)   Wt 85.7 kg   SpO2 98%   BMI 29.60 kg/m  Gen:   Awake, no distress   Resp:  Normal effort  MSK:   Moves extremities without difficulty  Other:    Medical Decision Making  Medically screening exam initiated at 10:31 AM.  Appropriate orders placed.  Steven Dodson was informed that the remainder of the evaluation will be completed by another provider, this initial triage assessment does not replace that evaluation, and the importance of remaining in the ED until their evaluation is complete.  Pt report feeling weak/fatigue x 1-2 weeks, no active cp or cold sxs.  Was seen by PCP today and had abnormal EKG, sent here for further eval.   [name], PA-C 05/06/21 1033    05/08/21, MD 05/06/21 [phone]

## 2021-05-06 NOTE — Significant Event (Signed)
   Called about bradycardia with frequent PVCs and weakness in this patient.  No syncope.  He is on labetolol 300 mg BID and hypertensive. Discussed with Dr. Hyacinth Meeker in ER.  Labetolol to be decreased and will plan a 14 day Zio patch to quantify PVCs and see if there is any significant bradycardia.  Low dose amlodipine to be added.  If high burden of PVCs noted, may need EP eval.   Corky Crafts, MD

## 2021-05-06 NOTE — ED Provider Notes (Signed)
I have personally discussed the results with the patient and his family member at the bedside, I also discussed the care with the cardiology team who recommends that the patient go on to 100 mg of labetalol twice daily instead of 300 mg as this may be causing some relatively symptomatic bradycardia.  The patient's current heart rate is 55, sinus bradycardia, normal pulses, he is completely asymptomatic and feeling well.  The cardiology office will reach out start the cardiac monitoring and arrange follow-up, the patient and his son are in agreement, amlodipine will be given and started prior to discharge.   Eber Hong, MD 05/06/21 [phone]

## 2021-05-08 ENCOUNTER — Ambulatory Visit: Payer: Medicare HMO

## 2021-05-08 ENCOUNTER — Other Ambulatory Visit: Payer: Self-pay

## 2021-05-08 DIAGNOSIS — I493 Ventricular premature depolarization: Secondary | ICD-10-CM

## 2021-05-19 DIAGNOSIS — I493 Ventricular premature depolarization: Secondary | ICD-10-CM | POA: Insufficient documentation

## 2021-05-19 DIAGNOSIS — I440 Atrioventricular block, first degree: Secondary | ICD-10-CM | POA: Insufficient documentation

## 2021-05-19 HISTORY — DX: Ventricular premature depolarization: I49.3

## 2021-05-19 HISTORY — DX: Atrioventricular block, first degree: I44.0

## 2021-06-08 ENCOUNTER — Telehealth: Payer: Self-pay | Admitting: *Deleted

## 2021-06-08 NOTE — Telephone Encounter (Signed)
Unable to reach pt on number in chart. Need to know if he has mailed his monitor back yet.  Spoke with son and got correct number; called pt and he stated had mailed monitor back. Looked at [employer] again and it stated his monitor is in transit. Should receive within next couple of days.

## 2021-06-18 NOTE — Progress Notes (Signed)
Cardiology Office Note:    Date:  06/19/2021   ID:  [name], DOB 10-23-1931, MRN [id number]  PCP:  [name], MD  Cardiologist:  Norman Herrlich, MD   Referring MD: [name], MD  ASSESSMENT:    1. PVC (premature ventricular contraction)   2. RBBB   3. Essential hypertension   4. Hyperlipidemia, unspecified hyperlipidemia type    PLAN:    In order of problems listed above:  In the emergency room he had frequent PVCs weakness bradycardia improved by reducing his high-dose labetalol blood pressure at home has been less than 140/80 monitor still shows periods of bigeminy.  It is abnormal EKG of asked him to go ahead and have an echocardiogram to screen for cardiomyopathy and continue his current antihypertensive medications including labetalol lisinopril thiazide diuretic amlodipine. No evidence of high degree heart block or sinus arrest improved with reduced dose beta-blocker At target continue current antihypertensives Continue statin  Next appointment I will see him in 6 to 8 weeks after his echo.   Medication Adjustments/Labs and Tests Ordered: Current medicines are reviewed at length with the patient today.  Concerns regarding medicines are outlined above.  No orders of the defined types were placed in this encounter.  No orders of the defined types were placed in this encounter.    Chief Complaint  Patient presents with   Hospitalization Follow-up    Abnormal EKG at Dough's office   Bradycardia    History of Present Illness:    Steven Dodson is a 87 y.o. male who is being seen today for the evaluation of bradycardia following  ED visit 05/06/2021 at the request of Dough, Steven Cheadle, MD. in the emergency room he is noted to have bradycardia with frequent PVCs he was on high-dose labetalol that was decreased hemoglobin was normal at 13.5 high-sensitivity troponin was normal initial and repeat His EKG shows sinus rhythm first-degree AV block and frequent  PVCs with bigeminy.  Concern in the emergency room with symptomatic bradycardia and ventricular arrhythmia he was discharged from the hospital with arrangements for outpatient event monitor and follow-up. His son is present participates in evaluation decision making Mr. Quirino is limited by visual loss from glaucoma and now lives at [facility] He has no known history of heart disease congenital rheumatic His weakness resolved when his dose of beta-blocker was decreased.  His monitor shows no pauses or high degree heart block and at times he has PVCs and bigeminy He is not having palpitations syncope edema chest pain patient says at times he finds himself short of breath with activity.  Past Medical History:  Diagnosis Date   Arthritis    Glaucoma    Gout    Hyperlipidemia    Hypertension     Past Surgical History:  Procedure Laterality Date   "navel base repair" per pt     COLONOSCOPY N/A 10/08/2017   Procedure: COLONOSCOPY;  Surgeon: Napoleon Form, MD;  Location: [facility] ENDOSCOPY;  Service: Endoscopy;  Laterality: N/A;   ESOPHAGOGASTRODUODENOSCOPY N/A 10/07/2017   Procedure: ESOPHAGOGASTRODUODENOSCOPY (EGD);  Surgeon: Sherrilyn Rist, MD;  Location[facility] ENDOSCOPY;  Service: Gastroenterology;  Laterality: N/A;   HERNIA REPAIR      Current Medications: Current Meds  Medication Sig   allopurinol (ZYLOPRIM) 300 MG tablet Take 300 mg by mouth daily.    amLODipine (NORVASC) 5 MG tablet Take 1 tablet (5 mg total) by mouth daily.   brimonidine (ALPHAGAN) 0.2 % ophthalmic solution  Place 1 drop into both eyes 2 (two) times daily with breakfast and lunch.   cetirizine (ZYRTEC) 10 MG tablet Take 10 mg by mouth daily.   dorzolamide-timolol (COSOPT) 22.3-6.8 MG/ML ophthalmic solution Place 1 drop into the right eye 2 (two) times daily with breakfast and lunch. With breakfast and lunch    finasteride (PROSCAR) 5 MG tablet Take 5 mg by mouth daily.   labetalol (NORMODYNE) 100 MG tablet  Take 1 tablet (100 mg total) by mouth 2 (two) times daily.   latanoprost (XALATAN) 0.005 % ophthalmic solution Place 1 drop into both eyes at bedtime.   lisinopril-hydrochlorothiazide (PRINZIDE,ZESTORETIC) 20-25 MG tablet Take 1 tablet by mouth daily.   Multiple Vitamin (MULTIVITAMIN WITH MINERALS) TABS tablet Take 1 tablet by mouth daily.   simvastatin (ZOCOR) 40 MG tablet Take 40 mg by mouth at bedtime.     Allergies:   Prednisone   Social History   Socioeconomic History   Marital status: Married    Spouse name: Not on file   Number of children: Not on file   Years of education: Not on file   Highest education level: Not on file  Occupational History   Not on file  Tobacco Use   Smoking status: Never   Smokeless tobacco: Never  Vaping Use   Vaping Use: Never used  Substance and Sexual Activity   Alcohol use: No   Drug use: No   Sexual activity: Not on file  Other Topics Concern   Not on file  Social History Narrative   Not on file   Social Determinants of Health   Financial Resource Strain: Not on file  Food Insecurity: Not on file  Transportation Needs: Not on file  Physical Activity: Not on file  Stress: Not on file  Social Connections: Not on file     Family History: The patient's family history includes CAD in his father; Heart failure in his mother. There is no history of Colon cancer.  ROS:   ROS Please see the history of present illness.     All other systems reviewed and are negative.  EKGs/Labs/Other Studies Reviewed:    The following studies were reviewed today:   Recent Labs: 05/06/2021: BUN 21; Creatinine, Ser 1.23; Hemoglobin 13.5; Platelets 157; Potassium 4.1; Sodium 138  Recent Lipid Panel No results found for: CHOL, TRIG, HDL, CHOLHDL, VLDL, LDLCALC, LDLDIRECT  Physical Exam:    VS:  BP 130/60 (BP Location: Right Arm, Patient Position: Sitting, Cuff Size: Normal)   Pulse 92   Ht 5\' 7"  (1.702 m)   Wt 196 lb (88.9 kg)   SpO2 94%   BMI  30.70 kg/m     Wt Readings from Last 3 Encounters:  06/19/21 196 lb (88.9 kg)  05/06/21 189 lb (85.7 kg)  06/20/18 189 lb 11.2 oz (86 kg)     GEN: Appears his age well nourished, well developed in no acute distress HEENT: Normal NECK: No JVD; No carotid bruits LYMPHATICS: No lymphadenopathy CARDIAC: RRR, no murmurs, rubs, gallops RESPIRATORY:  Clear to auscultation without rales, wheezing or rhonchi  ABDOMEN: Soft, non-tender, non-distended MUSCULOSKELETAL:  No edema; No deformity  SKIN: Warm and dry NEUROLOGIC:  Alert and oriented x 3 PSYCHIATRIC:  Normal affect     Signed, 13/05/19, MD  06/19/2021 1:50 PM    [facility]

## 2021-06-19 ENCOUNTER — Ambulatory Visit: Payer: Medicare HMO | Admitting: Cardiology

## 2021-06-19 ENCOUNTER — Encounter: Payer: Self-pay | Admitting: Cardiology

## 2021-06-19 ENCOUNTER — Other Ambulatory Visit: Payer: Self-pay

## 2021-06-19 VITALS — BP 130/60 | HR 92 | Ht 67.0 in | Wt 196.0 lb

## 2021-06-19 DIAGNOSIS — E785 Hyperlipidemia, unspecified: Secondary | ICD-10-CM | POA: Diagnosis not present

## 2021-06-19 DIAGNOSIS — I493 Ventricular premature depolarization: Secondary | ICD-10-CM

## 2021-06-19 DIAGNOSIS — I10 Essential (primary) hypertension: Secondary | ICD-10-CM | POA: Diagnosis not present

## 2021-06-19 DIAGNOSIS — I4510 Unspecified right bundle-branch block: Secondary | ICD-10-CM

## 2021-06-19 NOTE — Patient Instructions (Signed)
Medication Instructions:  Your physician recommends that you continue on your current medications as directed. Please refer to the Current Medication list given to you today.  *If you need a refill on your cardiac medications before your next appointment, please call your pharmacy*   Lab Work: None If you have labs (blood work) drawn today and your tests are completely normal, you will receive your results only by: . MyChart Message (if you have MyChart) OR . A paper copy in the mail If you have any lab test that is abnormal or we need to change your treatment, we will call you to review the results.   Testing/Procedures: Your physician has requested that you have an echocardiogram. Echocardiography is a painless test that uses sound waves to create images of your heart. It provides your doctor with information about the size and shape of your heart and how well your heart's chambers and valves are working. This procedure takes approximately one hour. There are no restrictions for this procedure.     Follow-Up: At [facility], you and your health needs are our priority.  As part of our continuing mission to provide you with exceptional heart care, we have created designated Provider Care Teams.  These Care Teams include your primary Cardiologist (physician) and Advanced Practice Providers (APPs -  Physician Assistants and Nurse Practitioners) who all work together to provide you with the care you need, when you need it.  We recommend signing up for the patient portal called "MyChart".  Sign up information is provided on this After Visit Summary.  MyChart is used to connect with patients for Virtual Visits (Telemedicine).  Patients are able to view lab/test results, encounter notes, upcoming appointments, etc.  Non-urgent messages can be sent to your provider as well.   To learn more about what you can do with MyChart, go to [email or web address]://www.mychart.com.    Your next appointment:   8  week(s)  The format for your next appointment:   In Person  Provider:   Brian Munley, MD   Other Instructions   

## 2021-06-29 ENCOUNTER — Ambulatory Visit: Payer: Medicare HMO | Admitting: Cardiovascular Disease

## 2021-07-01 ENCOUNTER — Other Ambulatory Visit: Payer: Self-pay

## 2021-07-01 ENCOUNTER — Ambulatory Visit (INDEPENDENT_AMBULATORY_CARE_PROVIDER_SITE_OTHER): Payer: Medicare HMO

## 2021-07-01 DIAGNOSIS — I4510 Unspecified right bundle-branch block: Secondary | ICD-10-CM

## 2021-07-01 DIAGNOSIS — I493 Ventricular premature depolarization: Secondary | ICD-10-CM | POA: Diagnosis not present

## 2021-07-01 LAB — ECHOCARDIOGRAM COMPLETE
Area-P 1/2: 3.21 cm2
P 1/2 time: 370 msec
S' Lateral: 2.4 cm

## 2021-07-03 ENCOUNTER — Telehealth: Payer: Self-pay

## 2021-07-03 NOTE — Telephone Encounter (Signed)
Spoke with patient regarding results and recommendation.  Patient verbalizes understanding and is agreeable to plan of care. Advised patient to call back with any issues or concerns.  

## 2021-07-03 NOTE — Telephone Encounter (Signed)
-----   Message from Baldo Daub, MD sent at 07/02/2021  5:46 PM EST ----- Normal or stable result  Good result no findings of heart injury or congestive heart failure

## 2021-08-21 DIAGNOSIS — M1990 Unspecified osteoarthritis, unspecified site: Secondary | ICD-10-CM | POA: Insufficient documentation

## 2021-08-21 DIAGNOSIS — H409 Unspecified glaucoma: Secondary | ICD-10-CM | POA: Insufficient documentation

## 2021-08-25 ENCOUNTER — Encounter: Payer: Self-pay | Admitting: Cardiology

## 2021-08-25 ENCOUNTER — Other Ambulatory Visit: Payer: Self-pay

## 2021-08-25 ENCOUNTER — Ambulatory Visit (INDEPENDENT_AMBULATORY_CARE_PROVIDER_SITE_OTHER): Payer: Medicare HMO | Admitting: Cardiology

## 2021-08-25 VITALS — BP 136/62 | HR 72 | Ht 67.0 in | Wt 196.6 lb

## 2021-08-25 DIAGNOSIS — I4510 Unspecified right bundle-branch block: Secondary | ICD-10-CM | POA: Diagnosis not present

## 2021-08-25 DIAGNOSIS — E785 Hyperlipidemia, unspecified: Secondary | ICD-10-CM

## 2021-08-25 DIAGNOSIS — I10 Essential (primary) hypertension: Secondary | ICD-10-CM

## 2021-08-25 DIAGNOSIS — I493 Ventricular premature depolarization: Secondary | ICD-10-CM | POA: Diagnosis not present

## 2021-08-25 NOTE — Progress Notes (Signed)
Cardiology Office Note:    Date:  08/25/2021   ID:  Steven Devoidaymond Mccamy, DOB 12-20-1931, MRN [id number]  PCP:  Olive Bassough, Robert L, MD  Cardiologist:  Norman HerrlichBrian Kelvin Burpee, MD    Referring MD: Olive Bassough, Robert L, MD    ASSESSMENT:    1. PVC (premature ventricular contraction)   2. RBBB   3. Essential hypertension   4. Hyperlipidemia, unspecified hyperlipidemia type    PLAN:    In order of problems listed above:  He is improved no longer symptomatic PVCs or lightheadedness and at this time I would not repeat his event monitor.  I encouraged his son to purchase the mobile Kardia device screen his heart rhythm and any questions can transmit the strips to me through MyChart. Stable no evidence of cardiomyopathy associated with bundle branch block Blood pressure is well controlled I encouraged him to check blood pressure several times a week record and bring back to the office he tells me his heart rates are running 60 to 70 bpm at home Continue statin lipids are at target Small insignificant pericardial effusion Mild aortic regurgitation not uncommon in his age group with degenerative aortic valve changes   Next appointment: 6 months   Medication Adjustments/Labs and Tests Ordered: Current medicines are reviewed at length with the patient today.  Concerns regarding medicines are outlined above.  No orders of the defined types were placed in this encounter.  No orders of the defined types were placed in this encounter.   Chief Complaint  Patient presents with   Follow-up    For symptomatic frequent PVCs    History of Present Illness:    Steven Dodson is a 88 y.o. male with a hx of frequent PVCs right bundle branch block hypertension and hyperlipidemia following emergency room visit last seen 05/06/2020.    Compliance with diet, lifestyle and medications: Yes  His son is present participates in evaluation decision making He has had no further episodes of lightheadedness bradycardia or  palpitation His event monitor was lost in the mail not repeated He is pleased with the quality of his life no edema shortness of breath or chest pain Recent labs with his primary care physician reviewed with patient and his son He is not checking his blood pressure at home he asked if he should be taking amlodipine. I reviewed the echocardiogram results with the patient and his  He had an echocardiogram performed 07/01/2021. There is mild concentric LVH grade 1 diastolic dysfunction normal ejection fraction 60 to 65% right ventricle is normal in size and function and there is a small circumferential pericardial effusion and also mild aortic regurgitation.  1. Left ventricular ejection fraction, by estimation, is 60 to 65%. The  left ventricle has normal function. The left ventricle has no regional  wall motion abnormalities. There is mild left ventricular hypertrophy.  Left ventricular diastolic parameters  are consistent with Grade I diastolic dysfunction (impaired relaxation).   2. Right ventricular systolic function is normal. The right ventricular  size is normal.   3. A small pericardial effusion is present. The pericardial effusion is  circumferential.   4. The mitral valve is normal in structure. No evidence of mitral valve  regurgitation. No evidence of mitral stenosis.   5. The aortic valve is normal in structure. There is mild calcification  of the aortic valve. Aortic valve regurgitation is mild. No aortic  stenosis is present.   6. The inferior vena cava is normal in size with greater than 50%  respiratory variability, suggesting right atrial pressure of 3 mmHg.   Past Medical History:  Diagnosis Date   Arthritis    Glaucoma    Gout    Hyperlipidemia    Hypertension     Past Surgical History:  Procedure Laterality Date   "navel base repair" per pt     COLONOSCOPY N/A 10/08/2017   Procedure: COLONOSCOPY;  Surgeon: Napoleon Form, MD;  Location: [facility] END[facility]COPY;   Service: Endoscopy;  Laterality: N/A;   ESOPHAGOGASTRODUODENOSCOPY N/A 10/07/2017   Procedure: ESOPHAGOGASTRODUODENOSCOPY (EGD);  Surgeon: Sherrilyn Rist, MD;  Location: [facility] ENDOSCOPY;  Service: Gastroenterology;  Laterality: N/A;   HERNIA REPAIR      Current Medications: Current Meds  Medication Sig   allopurinol (ZYLOPRIM) 300 MG tablet Take 300 mg by mouth daily.    amLODipine (NORVASC) 5 MG tablet Take 5 mg by mouth daily.   brimonidine (ALPHAGAN) 0.2 % ophthalmic solution Place 1 drop into both eyes 2 (two) times daily with breakfast and lunch.   cetirizine (ZYRTEC) 10 MG tablet Take 10 mg by mouth daily.   dorzolamide-timolol (COSOPT) 22.3-6.8 MG/ML ophthalmic solution Place 1 drop into the right eye 2 (two) times daily with breakfast and lunch. With breakfast and lunch    finasteride (PROSCAR) 5 MG tablet Take 5 mg by mouth daily.   labetalol (NORMODYNE) 100 MG tablet Take 100 mg by mouth every 12 (twelve) hours.   latanoprost (XALATAN) 0.005 % ophthalmic solution Place 1 drop into both eyes at bedtime.   lisinopril-hydrochlorothiazide (PRINZIDE,ZESTORETIC) 20-25 MG tablet Take 1 tablet by mouth daily.   Multiple Vitamin (MULTIVITAMIN WITH MINERALS) TABS tablet Take 1 tablet by mouth daily.   simvastatin (ZOCOR) 40 MG tablet Take 40 mg by mouth at bedtime.     Allergies:   Aspirin and Prednisone   Social History   Socioeconomic History   Marital status: Married    Spouse name: Not on file   Number of children: Not on file   Years of education: Not on file   Highest education level: Not on file  Occupational History   Not on file  Tobacco Use   Smoking status: Never    Passive exposure: Never   Smokeless tobacco: Never  Vaping Use   Vaping Use: Never used  Substance and Sexual Activity   Alcohol use: No   Drug use: No   Sexual activity: Not on file  Other Topics Concern   Not on file  Social History Narrative   Not on file   Social Determinants of Health    Financial Resource Strain: Not on file  Food Insecurity: Not on file  Transportation Needs: Not on file  Physical Activity: Not on file  Stress: Not on file  Social Connections: Not on file     Family History: The patient's family history includes CAD in his father; Heart failure in his mother. There is no history of Colon cancer. ROS:   Please see the history of present illness.    All other systems reviewed and are negative.  EKGs/Labs/Other Studies Reviewed:    The following studies were reviewed today:   Recent Labs: 05/06/2021: BUN 21; Creatinine, Ser 1.23; Hemoglobin 13.5; Platelets 157; Potassium 4.1; Sodium 138   08/19/2021: Cholesterol 159 triglycerides 298 HDL 38 non-HDL cholesterol 121 LDL 89 Creatinine 1.01 GFR 72 cc sodium 138 potassium 4.2 Hemoglobin 13.8  Physical Exam:    VS:  BP 136/62 (BP Location: Right Arm)    Pulse 72  Ht 5\' 7"  (1.702 m)    Wt 196 lb 9.6 oz (89.2 kg)    SpO2 97%    BMI 30.79 kg/m     Wt Readings from Last 3 Encounters:  08/25/21 196 lb 9.6 oz (89.2 kg)  06/19/21 196 lb (88.9 kg)  05/06/21 189 lb (85.7 kg)     GEN: Appears his age well nourished, well developed in no acute distress HEENT: Normal NECK: No JVD; No carotid bruits LYMPHATICS: No lymphadenopathy CARDIAC: No irregularity of pulse RRR, no murmurs, rubs, gallops RESPIRATORY:  Clear to auscultation without rales, wheezing or rhonchi  ABDOMEN: Soft, non-tender, non-distended MUSCULOSKELETAL:  No edema; No deformity  SKIN: Warm and dry NEUROLOGIC:  Alert and oriented x 3 PSYCHIATRIC:  Normal affect    Signed, 05/08/21, MD  08/25/2021 3:44 PM    [facility]

## 2021-08-25 NOTE — Patient Instructions (Signed)
Medication Instructions:  Your physician recommends that you continue on your current medications as directed. Please refer to the Current Medication list given to you today.  *If you need a refill on your cardiac medications before your next appointment, please call your pharmacy*   Lab Work:  If you have labs (blood work) drawn today and your tests are completely normal, you will receive your results only by: MyChart Message (if you have MyChart) OR A paper copy in the mail If you have any lab test that is abnormal or we need to change your treatment, we will call you to review the results.   Testing/Procedures: None   Follow-Up: At [facility], you and your health needs are our priority.  As part of our continuing mission to provide you with exceptional heart care, we have created designated Provider Care Teams.  These Care Teams include your primary Cardiologist (physician) and Advanced Practice Providers (APPs -  Physician Assistants and Nurse Practitioners) who all work together to provide you with the care you need, when you need it.  We recommend signing up for the patient portal called "MyChart".  Sign up information is provided on this After Visit Summary.  MyChart is used to connect with patients for Virtual Visits (Telemedicine).  Patients are able to view lab/test results, encounter notes, upcoming appointments, etc.  Non-urgent messages can be sent to your provider as well.   To learn more about what you can do with MyChart, go to [email or web address].    Your next appointment:   6 month(s)  The format for your next appointment:   In Person  Provider:    Dr. Dulce Sellar   Other Instructions  [email or web address]        FDA-cleared, clinical grade mobile EKG monitor: Steven Dodson is the most clinically-validated mobile EKG used by the world's leading cardiac care medical professionals With Basic service, know instantly if  your heart rhythm is normal or if atrial fibrillation is detected, and email the last single EKG recording to yourself or your doctor Premium service, available for purchase through the Kardia app for $9.99 per month or $99 per year, includes unlimited history and storage of your EKG recordings, a monthly EKG summary report to share with your doctor, along with the ability to track your blood pressure, activity and weight Includes one KardiaMobile phone clip FREE SHIPPING: Standard delivery 1-3 business days. Orders placed by 11:00am PST will ship that afternoon. Otherwise, will ship next business day. All orders ship via [employer] from [place], [place]

## 2021-12-31 DIAGNOSIS — R1032 Left lower quadrant pain: Secondary | ICD-10-CM | POA: Insufficient documentation

## 2021-12-31 HISTORY — DX: Left lower quadrant pain: R10.32

## 2022-05-14 ENCOUNTER — Encounter: Payer: Self-pay | Admitting: Cardiology

## 2022-05-14 ENCOUNTER — Ambulatory Visit: Payer: Medicare HMO | Attending: Cardiology | Admitting: Cardiology

## 2022-05-14 VITALS — BP 142/70 | HR 67 | Ht 67.0 in | Wt 190.4 lb

## 2022-05-14 DIAGNOSIS — I493 Ventricular premature depolarization: Secondary | ICD-10-CM | POA: Diagnosis not present

## 2022-05-14 DIAGNOSIS — I4510 Unspecified right bundle-branch block: Secondary | ICD-10-CM | POA: Diagnosis not present

## 2022-05-14 DIAGNOSIS — E785 Hyperlipidemia, unspecified: Secondary | ICD-10-CM | POA: Diagnosis not present

## 2022-05-14 DIAGNOSIS — I10 Essential (primary) hypertension: Secondary | ICD-10-CM

## 2022-05-14 NOTE — Progress Notes (Signed)
Cardiology Office Note:    Date:  05/14/2022   ID:  Steven Dodson, DOB 1932/04/13, MRN [id number]  PCP:  Algis Greenhouse, MD  Cardiologist:  Shirlee More, MD    Referring MD: Algis Greenhouse, MD    ASSESSMENT:    1. PVC (premature ventricular contraction)   2. RBBB   3. Essential hypertension    PLAN:    In order of problems listed above:  Lyrik has done well with his ventricular arrhythmia asymptomatic with his beta-blocker no PVCs noted on his EKG, at this time I do not think he requires a repeat monitor or an antiarrhythmic drug. Stable EKG pattern right bundle branch block first-degree AV block Pressure appears to be well controlled unfortunately using a wrist blood pressure cuff I discussed good technique advised him to purchase an Omron digital arm cuff and trend and record his blood pressure and continue his amlodipine labetalol lisinopril and thiazide diuretic. Good response LDL at target continue his statin  Next appointment: 9 months    Medication Adjustments/Labs and Tests Ordered: Current medicines are reviewed at length with the patient today.  Concerns regarding medicines are outlined above.  No orders of the defined types were placed in this encounter.  No orders of the defined types were placed in this encounter.   Chief Complaint  Patient presents with   Follow-up    Frequent PVC's    History of Present Illness:    Steven Dodson is a 88 y.o. male with a hx of frequent PVCs right bundle branch block hypertension hyperlipidemia small pericardial effusion and mild aortic regurgitation on echocardiogram in November 2022 last seen 08/25/2021.  Compliance with diet, lifestyle and medications: Yes  He has done well he has had no recurrent palpitation edema shortness of breath chest pain palpitation or syncope.  Recent labs [facility] PCP 02/19/2022 Total cholesterol 144 LDL 74 non-HDL cholesterol 106 12/31/2021 hemoglobin 14.2 Past Medical  History:  Diagnosis Date   Arthritis    Glaucoma    Gout    Hyperlipidemia    Hypertension     Past Surgical History:  Procedure Laterality Date   "navel base repair" per pt     COLONOSCOPY N/A 10/08/2017   Procedure: COLONOSCOPY;  Surgeon: Mauri Pole, MD;  Location: [place];  Service: Endoscopy;  Laterality: N/A;   ESOPHAGOGASTRODUODENOSCOPY N/A 10/07/2017   Procedure: ESOPHAGOGASTRODUODENOSCOPY (EGD);  Surgeon: Doran Stabler, MD;  Location: [place];  Service: Gastroenterology;  Laterality: N/A;   HERNIA REPAIR      Current Medications: Current Meds  Medication Sig   allopurinol (ZYLOPRIM) 300 MG tablet Take 300 mg by mouth daily.    amLODipine (NORVASC) 5 MG tablet Take 5 mg by mouth daily.   brimonidine (ALPHAGAN) 0.2 % ophthalmic solution Place 1 drop into both eyes 2 (two) times daily with breakfast and lunch.   cetirizine (ZYRTEC) 10 MG tablet Take 10 mg by mouth daily.   dorzolamide-timolol (COSOPT) 22.3-6.8 MG/ML ophthalmic solution Place 1 drop into the right eye 2 (two) times daily with breakfast and lunch. With breakfast and lunch    finasteride (PROSCAR) 5 MG tablet Take 5 mg by mouth daily.   labetalol (NORMODYNE) 100 MG tablet Take 100 mg by mouth every 12 (twelve) hours.   latanoprost (XALATAN) 0.005 % ophthalmic solution Place 1 drop into both eyes at bedtime.   lisinopril-hydrochlorothiazide (PRINZIDE,ZESTORETIC) 20-25 MG tablet Take 1 tablet by mouth daily.   Multiple Vitamin (MULTIVITAMIN WITH MINERALS)  TABS tablet Take 1 tablet by mouth daily.   simvastatin (ZOCOR) 40 MG tablet Take 40 mg by mouth at bedtime.     Allergies:   Aspirin and Prednisone   Social History   Socioeconomic History   Marital status: Married    Spouse name: Not on file   Number of children: Not on file   Years of education: Not on file   Highest education level: Not on file  Occupational History   Not on file  Tobacco Use   Smoking status: Never     Passive exposure: Never   Smokeless tobacco: Never  Vaping Use   Vaping Use: Never used  Substance and Sexual Activity   Alcohol use: No   Drug use: No   Sexual activity: Not on file  Other Topics Concern   Not on file  Social History Narrative   Not on file   Social Determinants of Health   Financial Resource Strain: Not on file  Food Insecurity: Not on file  Transportation Needs: Not on file  Physical Activity: Not on file  Stress: Not on file  Social Connections: Not on file     Family History: The patient's family history includes CAD in his father; Heart failure in his mother. There is no history of Colon cancer. ROS:   Please see the history of present illness.    All other systems reviewed and are negative.  EKGs/Labs/Other Studies Reviewed:    The following studies were reviewed today: He had an echocardiogram performed 07/01/2021. There is mild concentric LVH grade 1 diastolic dysfunction normal ejection fraction 60 to 65% right ventricle is normal in size and function and there is a small circumferential pericardial effusion and also mild aortic regurgitation.  1. Left ventricular ejection fraction, by estimation, is 60 to 65%. The  left ventricle has normal function. The left ventricle has no regional  wall motion abnormalities. There is mild left ventricular hypertrophy.  Left ventricular diastolic parameters  are consistent with Grade I diastolic dysfunction (impaired relaxation).   2. Right ventricular systolic function is normal. The right ventricular  size is normal.   3. A small pericardial effusion is present. The pericardial effusion is  circumferential.   4. The mitral valve is normal in structure. No evidence of mitral valve  regurgitation. No evidence of mitral stenosis.   5. The aortic valve is normal in structure. There is mild calcification  of the aortic valve. Aortic valve regurgitation is mild. No aortic  stenosis is present.   6. The  inferior vena cava is normal in size with greater than 50%  respiratory variability, suggesting right atrial pressure of 3 mmHg.  EKG:  EKG ordered today and personally reviewed.  The ekg ordered today demonstrates sinus rhythm right bundle branch block first-degree AV block left axis deviation    Physical Exam:    VS:  BP (!) 142/70   Pulse 67   Ht 5\' 7"  (1.702 m)   Wt 190 lb 6.4 oz (86.4 kg)   SpO2 96%   BMI 29.82 kg/m     Wt Readings from Last 3 Encounters:  05/14/22 190 lb 6.4 oz (86.4 kg)  08/25/21 196 lb 9.6 oz (89.2 kg)  06/19/21 196 lb (88.9 kg)     GEN:  Well nourished, well developed in no acute distress HEENT: Normal NECK: No JVD; No carotid bruits LYMPHATICS: No lymphadenopathy CARDIAC: RRR, no murmurs, rubs, gallops RESPIRATORY:  Clear to auscultation without rales, wheezing or rhonchi  ABDOMEN: Soft, non-tender, non-distended MUSCULOSKELETAL:  No edema; No deformity  SKIN: Warm and dry NEUROLOGIC:  Alert and oriented x 3 PSYCHIATRIC:  Normal affect    Signed, Shirlee More, MD  05/14/2022 11:57 AM    [place]

## 2022-05-14 NOTE — Patient Instructions (Signed)
Medication Instructions:  Your physician recommends that you continue on your current medications as directed. Please refer to the Current Medication list given to you today.  *If you need a refill on your cardiac medications before your next appointment, please call your pharmacy*   Lab Work: None If you have labs (blood work) drawn today and your tests are completely normal, you will receive your results only by: [place] (if you have MyChart) OR A paper copy in the mail If you have any lab test that is abnormal or we need to change your treatment, we will call you to review the results.   Testing/Procedures: None   Follow-Up: At [facility], you and your health needs are our priority.  As part of our continuing mission to provide you with exceptional heart care, we have created designated Provider Care Teams.  These Care Teams include your primary Cardiologist (physician) and Advanced Practice Providers (APPs -  Physician Assistants and Nurse Practitioners) who all work together to provide you with the care you need, when you need it.  We recommend signing up for the patient portal called "MyChart".  Sign up information is provided on this After Visit Summary.  MyChart is used to connect with patients for Virtual Visits (Telemedicine).  Patients are able to view lab/test results, encounter notes, upcoming appointments, etc.  Non-urgent messages can be sent to your provider as well.   To learn more about what you can do with MyChart, go to [email or web address].    Your next appointment:   1 year(s)  The format for your next appointment:   In Person  Provider:   Shirlee More, MD    Other Instructions Get an Omron digital blood pressure device  Important Information About Sugar

## 2022-07-27 DIAGNOSIS — R399 Unspecified symptoms and signs involving the genitourinary system: Secondary | ICD-10-CM | POA: Insufficient documentation

## 2022-07-27 HISTORY — DX: Unspecified symptoms and signs involving the genitourinary system: R39.9

## 2022-07-28 DIAGNOSIS — M79671 Pain in right foot: Secondary | ICD-10-CM | POA: Insufficient documentation

## 2022-07-28 HISTORY — DX: Pain in right foot: M79.671

## 2022-12-03 DIAGNOSIS — J042 Acute laryngotracheitis: Secondary | ICD-10-CM | POA: Insufficient documentation

## 2022-12-03 HISTORY — DX: Acute laryngotracheitis: J04.2

## 2023-03-22 DIAGNOSIS — R209 Unspecified disturbances of skin sensation: Secondary | ICD-10-CM

## 2023-03-22 HISTORY — DX: Unspecified disturbances of skin sensation: R20.9

## 2023-07-08 DIAGNOSIS — E872 Acidosis, unspecified: Secondary | ICD-10-CM | POA: Insufficient documentation

## 2023-07-08 DIAGNOSIS — K625 Hemorrhage of anus and rectum: Secondary | ICD-10-CM

## 2023-07-08 HISTORY — DX: Hemorrhage of anus and rectum: K62.5

## 2023-07-08 HISTORY — DX: Acidosis, unspecified: E87.20

## 2023-07-10 DIAGNOSIS — K5790 Diverticulosis of intestine, part unspecified, without perforation or abscess without bleeding: Secondary | ICD-10-CM

## 2023-07-10 DIAGNOSIS — K635 Polyp of colon: Secondary | ICD-10-CM

## 2023-07-10 HISTORY — DX: Polyp of colon: K63.5

## 2023-07-10 HISTORY — DX: Diverticulosis of intestine, part unspecified, without perforation or abscess without bleeding: K57.90

## 2023-07-14 IMAGING — CR DG CHEST 1V
1 series · 1 of 1 positions shown · non-contrast
Comparison: Chest radiographs 11/05/2020 and earlier.

CLINICAL DATA: 88-year-old male with weakness, fatigue for 2 weeks.

EXAM:
CHEST  1 VIEW

[chest ap]
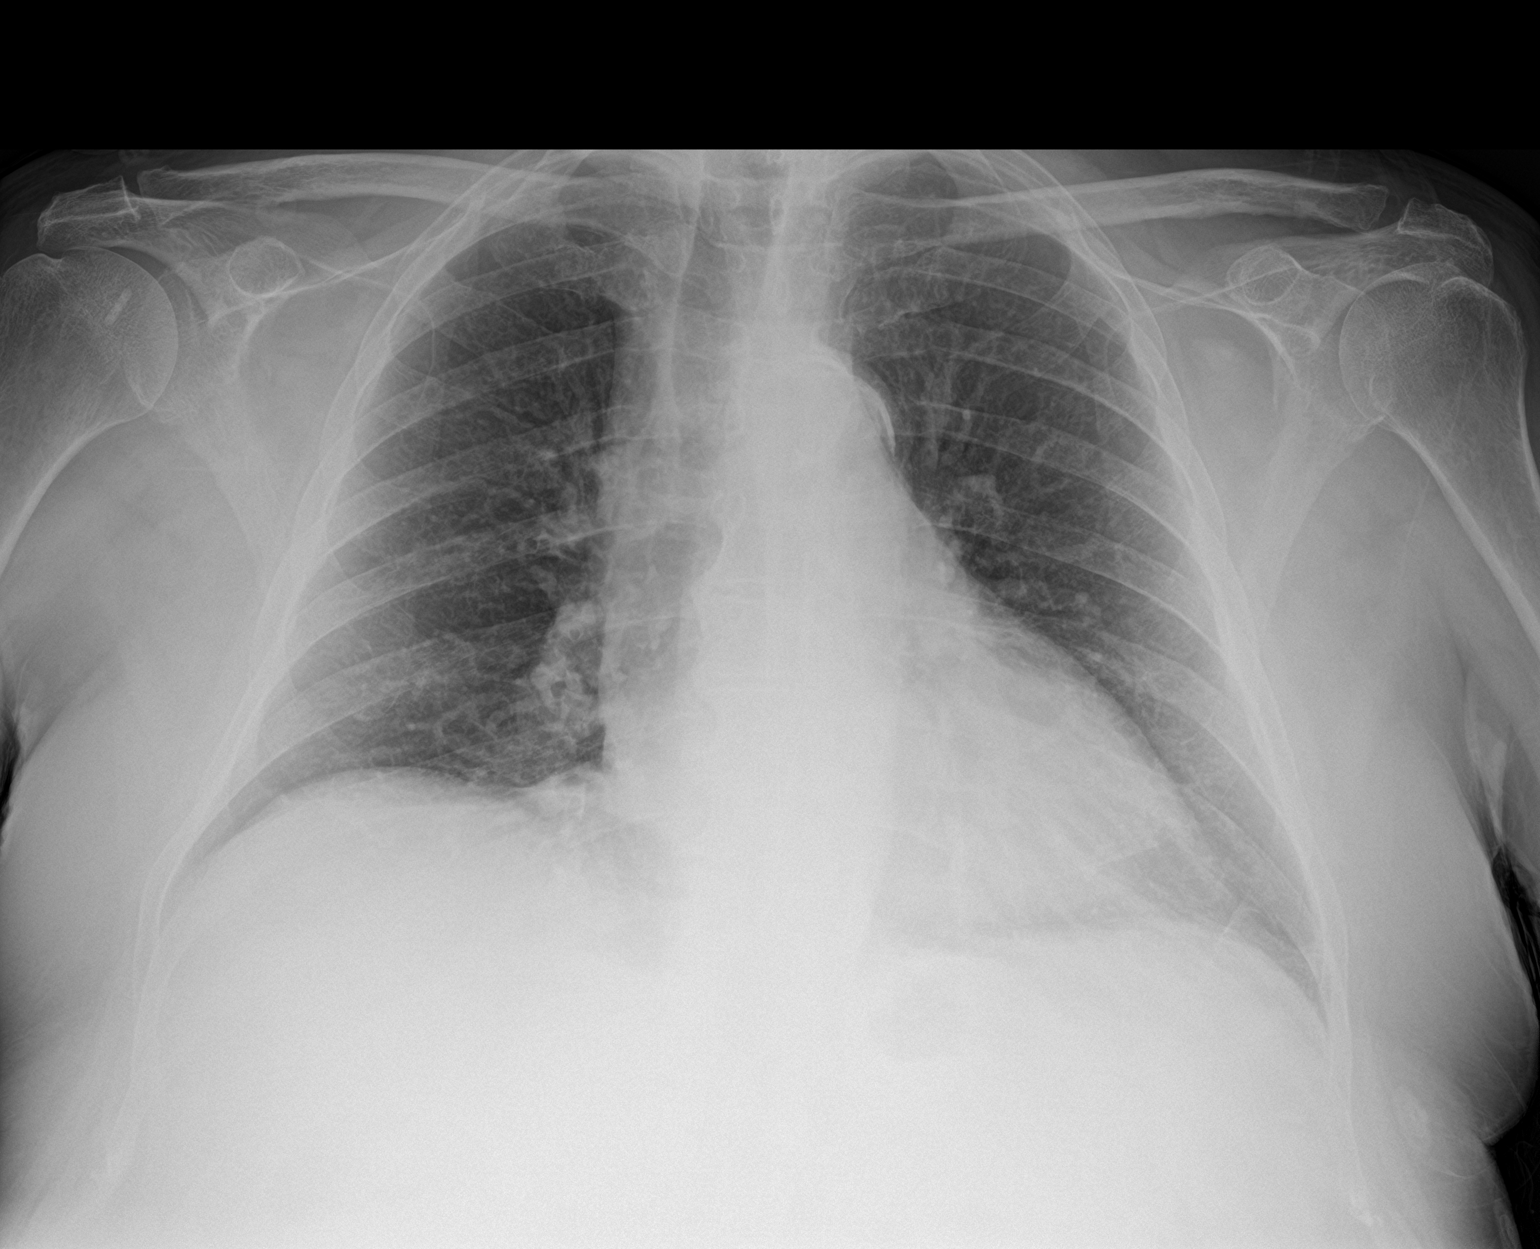

[1 of 1 positions shown; findings below may reference images not displayed]

FINDINGS: Upright AP view at 5502 hours. Stable somewhat low lung volumes.
Calcified aortic atherosclerosis. Other mediastinal contours are
within normal limits. Visualized tracheal air column is within
normal limits. No pneumothorax, pulmonary edema, pleural effusion or
confluent pulmonary opacity. Paucity of bowel gas in the upper
abdomen. No acute osseous abnormality identified.
IMPRESSION: No acute cardiopulmonary abnormality.

## 2023-12-20 DIAGNOSIS — R10A0 Flank pain, unspecified side: Secondary | ICD-10-CM | POA: Insufficient documentation

## 2023-12-21 ENCOUNTER — Ambulatory Visit

## 2023-12-21 VITALS — BP 110/58 | HR 44 | Ht 67.0 in | Wt 190.0 lb

## 2023-12-21 DIAGNOSIS — I10 Essential (primary) hypertension: Secondary | ICD-10-CM | POA: Diagnosis not present

## 2023-12-21 DIAGNOSIS — R001 Bradycardia, unspecified: Secondary | ICD-10-CM

## 2023-12-21 DIAGNOSIS — I493 Ventricular premature depolarization: Secondary | ICD-10-CM | POA: Diagnosis not present

## 2023-12-21 HISTORY — DX: Bradycardia, unspecified: R00.1

## 2023-12-21 NOTE — Patient Instructions (Signed)
 Medication Instructions:  Your physician recommends that you continue on your current medications as directed. Please refer to the Current Medication list given to you today.  *If you need a refill on your cardiac medications before your next appointment, please call your pharmacy*   Lab Work: None ordered If you have labs (blood work) drawn today and your tests are completely normal, you will receive your results only by: MyChart Message (if you have MyChart) OR A paper copy in the mail If you have any lab test that is abnormal or we need to change your treatment, we will call you to review the results.   Testing/Procedures: A zio monitor was ordered today. It will remain on for 14 days. Remove 01/04/24. You will then return monitor and event diary in provided box. It takes 1-2 weeks for report to be downloaded and returned to us . We will call you with the results. If monitor falls off or has orange flashing light, please call Zio for further instructions.    Follow-Up: At [facility], you and your health needs are our priority.  As part of our continuing mission to provide you with exceptional heart care, we have created designated Provider Care Teams.  These Care Teams include your primary Cardiologist (physician) and Advanced Practice Providers (APPs -  Physician Assistants and Nurse Practitioners) who all work together to provide you with the care you need, when you need it.  We recommend signing up for the patient portal called "MyChart".  Sign up information is provided on this After Visit Summary.  MyChart is used to connect with patients for Virtual Visits (Telemedicine).  Patients are able to view lab/test results, encounter notes, upcoming appointments, etc.  Non-urgent messages can be sent to your provider as well.   To learn more about what you can do with MyChart, go to [email or web address].    Your next appointment:   6 month(s)  The format for your next  appointment:   In Person  Provider:   Bertha Broad, MD    Other Instructions none  Important Information About Sugar

## 2023-12-21 NOTE — Progress Notes (Signed)
 Cardiology Consultation:    Date:  12/21/2023   ID:  Steven Dodson, DOB Nov 03, 1931, MRN [id number]  PCP:  Steven Schirmer, MD  Cardiologist:  Steven Evans Joyanne Eddinger, MD   Referring MD: Steven Schirmer, MD   Chief Complaint  Patient presents with   Follow-up     ASSESSMENT AND PLAN:   Steven Dodson with history of frequent PVCs, right bundle branch block, hypertension, hyperlipidemia, mild aortic insufficiency and small pericardial effusion on prior echocardiogram from November 2022 with normal biventricular function LVEF 60 to 65% and mild concentric left ventricular hypertrophy follows up for his regular visit.  Problem List Items Addressed This Visit     Essential hypertension   Well-controlled. On multiple medications at this time amlodipine  5 mg once daily, lisinopril-hydrochlorothiazide 20 mg - 25 mg once daily Labetalol  100 mg twice daily. In the setting of bradycardia labetalol  dose was been reduced to 50 mg twice daily today.       Frequent PVCs - Primary   Evidence of frequent PVCs and bigeminy pattern on prior EKG from September 2022 Has been maintained on labetalol  ever since. Event monitor in September 2022 was done but the device appears to have been lost in the mail and we do not have the results.  No PVCs on EKG today. However heart rate slow with sinus bradycardia and 44 bpm.  Will reduce the dose of labetalol  to 50 mg twice daily. Will obtain a 14-day Zio patch monitor to assess for overall ventricular ectopy burden.      Relevant Orders   EKG 12-Lead (Completed)   LONG TERM MONITOR (3-14 DAYS)   Sinus bradycardia   Sinus bradycardia cardia likely related to underlying conduction abnormalities and aging, compounded by ongoing treatment with labetalol  100 mg twice daily.  Will reduce the dose of labetalol  to 50 mg twice daily. Will obtain Zio patch for 14 days to assess heart rate and rhythm.       Relevant Orders   LONG TERM MONITOR (3-14 DAYS)    Return to clinic tentatively in 6 months   History of Present Illness:    Steven Dodson is a 89 y.o. male who is being seen today for follow-up visit. PCP is Steven Dodson, Steven Beckers, MD. Last visit with our office was Steven Dodson 05-14-2022.  Pleasant man here for the visit accompanied by his son.  He lives at [facility] living facility.  At times gets meals from the facility and at times prepares his own meals typically TV dinners.  Has history of frequent PVCs, right bundle branch block, hypertension, hyperlipidemia, mild aortic insufficiency, small pericardial effusion noted on prior echocardiogram in November 2022.  Vision impaired due to bilateral glaucoma able to read medication labels with a magnifying lens at home.  Overall doing well.  Denies any chest pain, shortness of breath.  Walks using his walker, to help with balance.  Denies any palpitations.  Denies any lightheadedness, dizziness.  For back pain he took tizanidine last night and this morning and mentions this is making him sleepy.  EKG in the clinic today shows sinus bradycardia with heart rate 44/min, PR interval prolonged 232 ms, left axis deviation and QRS morphology consistent with right bundle branch block 140 ms duration.  In comparison prior EKG from May 14, 2022 heart rate was 67/min, QRS was similar with right bundle branch block morphology.  Lipid panel from 09-26-2023 total cholesterol 150, triglycerides 126, HDL 48, LDL 79.  Reportedly had an event  monitor done in September 2022 which he mailed back but got lost and could not receive the results.   Past Medical History:  Diagnosis Date   Arthritis    Glaucoma    Gout    Hyperlipidemia    Hypertension     Past Surgical History:  Procedure Laterality Date   "navel base repair" per pt     COLONOSCOPY N/A 10/08/2017   Procedure: COLONOSCOPY;  Surgeon: Steven Dandy, MD;  Location: [facility] END[facility]COPY;  Service: Endoscopy;  Laterality: N/A;    ESOPHAGOGASTRODUODENOSCOPY N/A 10/07/2017   Procedure: ESOPHAGOGASTRODUODENOSCOPY (EGD);  Surgeon: Steven Hugger, MD;  [facility];  Service: Gastroenterology;  Laterality: N/A;   HERNIA REPAIR      Current Medications: Current Meds  Medication Sig   allopurinol  (ZYLOPRIM ) 300 MG tablet Take 300 mg by mouth daily.    amLODipine  (NORVASC ) 5 MG tablet Take 5 mg by mouth daily.   brimonidine  (ALPHAGAN ) 0.2 % ophthalmic solution Place 1 drop into both eyes 2 (two) times daily with breakfast and lunch.   cetirizine (ZYRTEC) 10 MG tablet Take 10 mg by mouth daily.   dorzolamide -timolol  (COSOPT ) 22.3-6.8 MG/ML ophthalmic solution Place 1 drop into the right eye 2 (two) times daily with breakfast and lunch. With breakfast and lunch    finasteride (PROSCAR) 5 MG tablet Take 5 mg by mouth daily.   labetalol  (NORMODYNE ) 100 MG tablet Take 100 mg by mouth every 12 (twelve) hours.   lisinopril-hydrochlorothiazide (PRINZIDE,ZESTORETIC) 20-25 MG tablet Take 1 tablet by mouth daily.   Multiple Vitamin (MULTIVITAMIN WITH MINERALS) TABS tablet Take 1 tablet by mouth daily.   Netarsudil-Latanoprost  (ROCKLATAN) 0.02-0.005 % SOLN Place 1 drop into both eyes at bedtime.   simvastatin (ZOCOR) 40 MG tablet Take 40 mg by mouth at bedtime.   tiZANidine (ZANAFLEX) 2 MG tablet Take 2 mg by mouth every 6 (six) hours as needed for muscle spasms.   [DISCONTINUED] latanoprost  (XALATAN ) 0.005 % ophthalmic solution Place 1 drop into both eyes at bedtime.     Allergies:   Aspirin and Prednisone   Social History   Socioeconomic History   Marital status: Married    Spouse name: Not on file   Number of children: Not on file   Years of education: Not on file   Highest education level: Not on file  Occupational History   Not on file  Tobacco Use   Smoking status: Never    Passive exposure: Never   Smokeless tobacco: Never  Vaping Use   Vaping status: Never Used  Substance and Sexual Activity    Alcohol use: No   Drug use: No   Sexual activity: Not on file  Other Topics Concern   Not on file  Social History Narrative   Not on file   Social Drivers of Health   Financial Resource Strain: Low Risk  (07/08/2023)   Received from [facility] [employer] Strain (CARDIA)    Difficulty of Paying Living Expenses: Not hard at all  Food Insecurity: Low Risk  (09/26/2023)   Received from [facility]   Hunger Vital Sign    Worried About Running Out of Food in the Last Year: Never true    Ran Out of Food in the Last Year: Never true  Transportation Needs: No Transportation Needs (09/26/2023)   Received from [facility]    In the past 12 months, has lack of reliable transportation kept you from  medical appointments, meetings, work or from getting things needed for daily living? : No  Physical Activity: Inactive (02/12/2022)   Received from [facility] visits prior to 10/16/2022., [facility] visits prior to 10/16/2022., [facility]   Exercise Vital Sign    Days of Exercise per Week: 0 days    Minutes of Exercise per Session: 0 min  Stress: No Stress Concern Present (02/12/2022)   Received from [facility] visits prior to 10/16/2022., [facility] visits prior to 10/16/2022., [facility]   [employer] of Occupational Health - Occupational Stress Questionnaire    Feeling of Stress : Not at all  Social Connections: Moderately Isolated (02/12/2022)   Received from [facility] visits prior to 10/16/2022., [facility] visits prior to 10/16/2022., [facility]   Social Connection and Isolation Panel [NHANES]    Frequency of Communication with Friends and Family: More than three times a week    Frequency of Social Gatherings with Friends and Family: Once a week    Attends Religious Services: More than 4 times per year     Active Member of [employer] or Organizations: No    Attends Banker Meetings: Never    Marital Status: Widowed     Family History: The patient's family history includes CAD in his father; Heart failure in his mother. There is no history of Colon cancer. ROS:   Please see the history of present illness.    All 14 point review of systems negative except as described per history of present illness.  EKGs/Labs/Other Studies Reviewed:    The following studies were reviewed today:   EKG:  EKG Interpretation Date/Time:  Wednesday Dec 21 2023 10:14:22 EDT Ventricular Rate:  44 PR Interval:  232 QRS Duration:  140 QT Interval:  494 QTC Calculation: 422 R Axis:   -58  Text Interpretation: Marked sinus bradycardia with sinus arrhythmia with 1st degree A-V block Left axis deviation Right bundle branch block Abnormal ECG When compared with ECG of 06-May-2021 10:21, Premature ventricular complexes are no longer Present Vent. rate has decreased BY  21 BPM QT has shortened Confirmed by Bertha Broad reddy [id number]) on 12/21/2023 10:44:00 AM    Recent Labs: No results found for requested labs within last 365 days.  Recent Lipid Panel No results found for: "CHOL", "TRIG", "HDL", "CHOLHDL", "VLDL", "LDLCALC", "LDLDIRECT"  Physical Exam:    VS:  BP (!) 110/58   Pulse (!) 44   Ht 5\' 7"  (1.702 m)   Wt 190 lb (86.2 kg)   SpO2 96%   BMI 29.76 kg/m     Wt Readings from Last 3 Encounters:  12/21/23 190 lb (86.2 kg)  05/14/22 190 lb 6.4 oz (86.4 kg)  08/25/21 196 lb 9.6 oz (89.2 kg)     GENERAL:  Well nourished, well developed in no acute distress NECK: No JVD; No carotid bruits CARDIAC: RRR, S1 and S2 present, no murmurs, no rubs, no gallops CHEST:  Clear to auscultation without rales, wheezing or rhonchi  Extremities: No pitting pedal edema. Pulses bilaterally symmetric with radial 2+ and dorsalis pedis 2+ NEUROLOGIC:  Alert and oriented x 3  Medication Adjustments/Labs  and Tests Ordered: Current medicines are reviewed at length with the patient today.  Concerns regarding medicines are outlined above.  Orders Placed This Encounter  Procedures   LONG TERM MONITOR (3-14 DAYS)   EKG 12-Lead  No orders of the defined types were placed in this encounter.   Signed, Lura Sallies, MD, MPH, [facility]. 12/21/2023 11:06 AM     [facility]

## 2023-12-21 NOTE — Assessment & Plan Note (Signed)
 Well-controlled. On multiple medications at this time amlodipine  5 mg once daily, lisinopril-hydrochlorothiazide 20 mg - 25 mg once daily Labetalol  100 mg twice daily. In the setting of bradycardia labetalol  dose was been reduced to 50 mg twice daily today.

## 2023-12-21 NOTE — Assessment & Plan Note (Signed)
 Evidence of frequent PVCs and bigeminy pattern on prior EKG from September 2022 Has been maintained on labetalol  ever since. Event monitor in September 2022 was done but the device appears to have been lost in the mail and we do not have the results.  No PVCs on EKG today. However heart rate slow with sinus bradycardia and 44 bpm.  Will reduce the dose of labetalol  to 50 mg twice daily. Will obtain a 14-day Zio patch monitor to assess for overall ventricular ectopy burden.

## 2023-12-21 NOTE — Assessment & Plan Note (Signed)
 Sinus bradycardia cardia likely related to underlying conduction abnormalities and aging, compounded by ongoing treatment with labetalol  100 mg twice daily.  Will reduce the dose of labetalol  to 50 mg twice daily. Will obtain Zio patch for 14 days to assess heart rate and rhythm.

## 2024-01-30 DIAGNOSIS — K8020 Calculus of gallbladder without cholecystitis without obstruction: Secondary | ICD-10-CM | POA: Insufficient documentation

## 2024-03-29 DIAGNOSIS — K4091 Unilateral inguinal hernia, without obstruction or gangrene, recurrent: Secondary | ICD-10-CM | POA: Insufficient documentation

## 2024-06-26 NOTE — Progress Notes (Unsigned)
 Cardiology Office Note:    Date:  06/27/2024   ID:  Steven Dodson, DOB 03/05/1932, MRN [id number]  PCP:  Steven Lamar CROME, MD  Cardiologist:  Steven Leiter, MD    Referring MD: Steven Lamar CROME, MD    ASSESSMENT:    1. Frequent PVCs   2. RBBB   3. Essential hypertension   4. Mixed hyperlipidemia   5. Malignant pericardial effusion (HCC)    PLAN:    In order of problems listed above:  Improved by event monitor on beta-blocker Stable EKG pattern in the summer Unsure about blood pressure control of asked him only to use the arm device and give me a list in 2 weeks and be very cautious not to overtreat hypertension and for now continue multi drugs amlodipine  labetalol  ACE thiazide diuretic Continue with statin Soft ejection murmur I would not repeat an echocardiogram at this time   Next appointment: 1 year   Medication Adjustments/Labs and Tests Ordered: Current medicines are reviewed at length with the patient today.  Concerns regarding medicines are outlined above.  No orders of the defined types were placed in this encounter.  No orders of the defined types were placed in this encounter.    History of Present Illness:    Steven Dodson is a 89 y.o. male with a hx of PVCs right bundle branch block hypertension hyperlipidemia mild aortic insufficiency and small pericardial effusion.  Last seen 12/21/2023.  He was last seen by me in September 2023 for PVCs hypertension and right bundle branch block  Recent labs 03/26/2024 CMP with a sodium 136 potassium 4.1 creatinine 1.07 GFR 66 cc/min normal liver function test cholesterol 140 LDL 76 non-HDL cholesterol 98.  He had an event monitor in May of this year both supraventricular and ventricular ectopy were isolated no episodes of atrial fibrillation flutter or bradycardia.  Compliance with diet, lifestyle and medications: Yes  His son is present participates in evaluation decision making.  Steven Dod[place] limited by his visual impairment He checking blood pressure mixing a wrist device in her arm device and gets higher readings with the arm device In his age group goal blood pressure be 150 systolic 5 and have asked him to only use the arm device good technique 2 weeks once a day and bring me a list of medications He has a soft ejection murmur went back looked through his chart and echocardiogram in 2022 showed a normal aortic valve with no calcification I do not think we need to repeat Past Medical History:  Diagnosis Date   Acute blood loss anemia 10/07/2017   Acute GI bleeding 10/07/2017   Acute laryngotracheitis 12/03/2022   12/03/2022     Allergic rhinitis 05/11/2016   Anemia, macrocytic 11/17/2016   2018     Angioedema 05/02/2018   2019: right face, after flu shot     Arthritis    At high risk for injury related to fall 02/27/2021   Balanoposthitis 06/23/2020   Benign paroxysmal positional vertigo due to bilateral vestibular disorder 09/26/2018   Benign prostatic hyperplasia (BPH) with urinary urgency 10/08/2020   Bilateral cold feet 03/22/2023   03/22/2023     Calculus of gallbladder without cholecystitis without obstruction 01/30/2024   01/30/2024: recurrent pain, stones on CT     Cerebellar nystagmus 11/12/2015   2017: onset     Cough 02/04/2021   DDD (degenerative disc disease), lumbar 11/08/2015   Diverticulitis 06/16/2018   Diverticulosis 07/10/2023  Diverticulosis of colon with hemorrhage    Elevated PSA 11/08/2015   Managed UROL     Fatigue 11/16/2016   2018:     First degree heart block 05/19/2021   04/2021     Flank pain 12/20/2023   12/20/2023 : right     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   05/06/2021: ED eval, ZIO patch     GI bleed 06/16/2018   Glaucoma    Gout    Hematochezia    Hemorrhoids, external, thrombosed 08/03/2019   2020: 3OC     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   2019: life-threatening, APT stopped      HLD (hyperlipidemia) 10/07/2017   Hyperlipidemia    Hyperplasia of prostate with lower urinary tract symptoms (LUTS) 02/24/2016   2022: declined urolift, cysto  07/28/2022: progressive, see UROL again     Hypertension    Increasing residual urine 01/26/2021   Insomnia 08/05/2020   Lactic acidosis 07/08/2023   Left lower quadrant pain 12/31/2021   12/31/2021     Macular degeneration 06/09/2016   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   Pain in both feet 07/28/2022   07/28/2022 : see POD     Polyp of intestine 07/10/2023   Prediabetes 11/08/2015   Rectal bleeding 07/08/2023   Sinus bradycardia 12/21/2023   Skin lesion of back 06/08/2019   Syncope 06/16/2018   Tinea corporis 06/08/2019   Transient ischemic attack (TIA) 12/24/2015   Umbilical hernia 05/11/2016   Unilateral recurrent inguinal hernia without obstruction or gangrene 03/29/2024   03/29/2024: recurrent right     Urinary symptom or sign 07/27/2022   07/27/2022     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    amLODipine  (NORVASC ) 5 MG tablet Take 5 mg by mouth daily.   brimonidine  (ALPHAGAN ) 0.2 % ophthalmic solution Place 1 drop into both eyes 2 (two) times daily with breakfast and lunch.   cetirizine (ZYRTEC) 10 MG tablet Take 10 mg by mouth daily.   dorzolamide -timolol  (COSOPT ) 22.3-6.8 MG/ML ophthalmic solution Place 1 drop into the right eye 2 (two) times daily with breakfast and lunch. With breakfast and lunch    finasteride (PROSCAR) 5 MG tablet Take 5 mg by mouth daily.   labetalol  (NORMODYNE ) 100 MG tablet Take 100 mg by mouth every 12 (twelve) hours.   lisinopril-hydrochlorothiazide (PRINZIDE,ZESTORETIC) 20-25 MG tablet Take 1 tablet by mouth daily.   Multiple Vitamin (MULTIVITAMIN WITH MINERALS) TABS tablet Take 1 tablet by mouth daily.   Netarsudil-Latanoprost  (ROCKLATAN) 0.02-0.005 % SOLN Place 1  drop into both eyes at bedtime.   simvastatin (ZOCOR) 40 MG tablet Take 40 mg by mouth at bedtime.   tiZANidine (ZANAFLEX) 2 MG tablet Take 2 mg by mouth every 6 (six) hours as needed for muscle spasms.      EKGs/Labs/Other Studies Reviewed:    The following studies were reviewed today:  Cardiac Studies & Procedures   ______________________________________________________________________________________________     ECHOCARDIOGRAM  ECHOCARDIOGRAM COMPLETE 07/01/2021  Narrative ECHOCARDIOGRAM REPORT    Patient Name:   KACETON VIEAU Date of Exam: 07/01/2021 Medical Rec #:  [id number]     Height:       67.0 in Accession #:    [id number]    Weight:       196.0 lb Date of Birth:  [birth year]    BSA:          2.005 m  Patient Age:    88 years      BP:           130/60 mmHg Patient Gender: M             HR:           70 bpm. Exam Location:  [facility]  Procedure: 2D Echo, Cardiac Doppler, Color Doppler and Strain Analysis  Indications:    PVC (premature ventricular contraction) [I49.3 (ICD-10-CM)]; RBBB [I45.10 (ICD-10-CM)]  History:        Patient has no prior history of Echocardiogram examinations. Arrythmias:Bradycardia.  Sonographer:    Lynwood Silvas RDCS Referring Phys: [id number] Levander Katzenstein J Atlas Kuc  IMPRESSIONS   1. Left ventricular ejection fraction, by estimation, is 60 to 65%. The left ventricle has normal function. The left ventricle has no regional wall motion abnormalities. There is mild left ventricular hypertrophy. Left ventricular diastolic parameters are consistent with Grade I diastolic dysfunction (impaired relaxation). 2. Right ventricular systolic function is normal. The right ventricular size is normal. 3. A small pericardial effusion is present. The pericardial effusion is circumferential. 4. The mitral valve is normal in structure. No evidence of mitral valve regurgitation. No evidence of mitral stenosis. 5. The aortic valve is normal in structure. There is mild  calcification of the aortic valve. Aortic valve regurgitation is mild. No aortic stenosis is present. 6. The inferior vena cava is normal in size with greater than 50% respiratory variability, suggesting right atrial pressure of 3 mmHg.  FINDINGS Left Ventricle: Left ventricular ejection fraction, by estimation, is 60 to 65%. The left ventricle has normal function. The left ventricle has no regional wall motion abnormalities. The left ventricular internal cavity size was normal in size. There is mild left ventricular hypertrophy. Left ventricular diastolic parameters are consistent with Grade I diastolic dysfunction (impaired relaxation).  Right Ventricle: The right ventricular size is normal. No increase in right ventricular wall thickness. Right ventricular systolic function is normal.  Left [facility]: Left atrial size was normal in size.  Right [facility]: Right atrial size was normal in size.  Pericardium: A small pericardial effusion is present. The pericardial effusion is circumferential.  Mitral Valve: The mitral valve is normal in structure. No evidence of mitral valve regurgitation. No evidence of mitral valve stenosis.  Tricuspid Valve: The tricuspid valve is normal in structure. Tricuspid valve regurgitation is trivial. No evidence of tricuspid stenosis.  Aortic Valve: The aortic valve is normal in structure. There is mild calcification of the aortic valve. Aortic valve regurgitation is mild. Aortic regurgitation PHT measures 370 msec. No aortic stenosis is present.  Pulmonic Valve: The pulmonic valve was normal in structure. Pulmonic valve regurgitation is not visualized. No evidence of pulmonic stenosis.  Aorta: The aortic root is normal in size and structure.  Venous: The inferior vena cava is normal in size with greater than 50% respiratory variability, suggesting right atrial pressure of 3 mmHg.  IAS/Shunts: No atrial level shunt detected by color flow Doppler.   LEFT  VENTRICLE PLAX 2D LVIDd:         4.30 cm   Diastology LVIDs:         2.40 cm   LV e' medial:    3.56 cm/s LV PW:         1.30 cm   LV E/e' medial:  22.0 LV IVS:        1.30 cm   LV e' lateral:   4.46 cm/s LVOT diam:     2.20 cm  LV E/e' lateral: 17.6 LV SV:         82 LV SV Index:   41 LVOT Area:     3.80 cm   RIGHT VENTRICLE             IVC RV S prime:     12.60 cm/s  IVC diam: 1.30 cm TAPSE (M-mode): 2.9 cm  LEFT ATRIUM             Index        RIGHT ATRIUM           Index LA diam:        4.70 cm 2.34 cm/m   RA Area:     16.80 cm LA Vol (A2C):   61.6 ml 30.72 ml/m  RA Volume:   45.50 ml  22.69 ml/m LA Vol (A4C):   44.7 ml 22.29 ml/m LA Biplane Vol: 55.3 ml 27.58 ml/m AORTIC VALVE LVOT Vmax:   95.00 cm/s LVOT Vmean:  64.100 cm/s LVOT VTI:    0.215 m AI PHT:      370 msec  AORTA Ao Root diam: 3.80 cm Ao Asc diam:  3.60 cm Ao Desc diam: 2.15 cm  MITRAL VALVE                TRICUSPID VALVE MV Area (PHT): 3.21 cm     TR Peak grad:   7.1 mmHg MV Decel Time: 236 msec     TR Vmax:        133.00 cm/s MV E velocity: 78.40 cm/s MV A velocity: 106.00 cm/s  SHUNTS MV E/A ratio:  0.74         Systemic VTI:  0.22 m Systemic Diam: 2.20 cm  Lamar Fitch MD Electronically signed by Lamar Fitch MD Signature Date/Time: 07/01/2021/5:06:16 PM    Final          ______________________________________________________________________________________________       Physical Exam:    VS:  BP (!) 160/68   Pulse 74   Ht 5' 7 (1.702 m)   Wt 189 lb 3.2 oz (85.8 kg)   SpO2 97%   BMI 29.63 kg/m     Wt Readings from Last 3 Encounters:  06/27/24 189 lb 3.2 oz (85.8 kg)  12/21/23 190 lb (86.2 kg)  05/14/22 190 lb 6.4 oz (86.4 kg)     GEN:  Well nourished, well developed in no acute distress HEENT: Normal NECK: No JVD; No carotid bruits LYMPHATICS: No lymphadenopathy CARDIAC: RRR, no murmurs, rubs, gallops RESPIRATORY:  Clear to auscultation without rales,  wheezing or rhonchi  ABDOMEN: Soft, non-tender, non-distended MUSCULOSKELETAL:  No edema; No deformity  SKIN: Warm and dry NEUROLOGIC:  Alert and oriented x 3 PSYCHIATRIC:  Normal affect    Signed, Steven Leiter, MD  06/27/2024 9:50 AM    [facility]

## 2024-06-27 ENCOUNTER — Ambulatory Visit: Admitting: Cardiology

## 2024-06-27 ENCOUNTER — Encounter: Payer: Self-pay | Admitting: Cardiology

## 2024-06-27 VITALS — BP 160/68 | HR 74 | Ht 67.0 in | Wt 189.2 lb

## 2024-06-27 DIAGNOSIS — E782 Mixed hyperlipidemia: Secondary | ICD-10-CM | POA: Diagnosis not present

## 2024-06-27 DIAGNOSIS — I4510 Unspecified right bundle-branch block: Secondary | ICD-10-CM

## 2024-06-27 DIAGNOSIS — I493 Ventricular premature depolarization: Secondary | ICD-10-CM

## 2024-06-27 DIAGNOSIS — I10 Essential (primary) hypertension: Secondary | ICD-10-CM | POA: Diagnosis not present

## 2024-06-27 NOTE — Patient Instructions (Signed)
 Medication Instructions:  Your physician recommends that you continue on your current medications as directed. Please refer to the Current Medication list given to you today.  *If you need a refill on your cardiac medications before your next appointment, please call your pharmacy*  Lab Work: None If you have labs (blood work) drawn today and your tests are completely normal, you will receive your results only by: MyChart Message (if you have MyChart) OR A paper copy in the mail If you have any lab test that is abnormal or we need to change your treatment, we will call you to review the results.  Testing/Procedures: None  Follow-Up: At [facility], you and your health needs are our priority.  As part of our continuing mission to provide you with exceptional heart care, our providers are all part of one team.  This team includes your primary Cardiologist (physician) and Advanced Practice Providers or APPs (Physician Assistants and Nurse Practitioners) who all work together to provide you with the care you need, when you need it.  Your next appointment:   1 year(s)  Provider:   Redell Leiter, MD    We recommend signing up for the patient portal called MyChart.  Sign up information is provided on this After Visit Summary.  MyChart is used to connect with patients for Virtual Visits (Telemedicine).  Patients are able to view lab/test results, encounter notes, upcoming appointments, etc.  Non-urgent messages can be sent to your provider as well.   To learn more about what you can do with MyChart, go to [email or web address].   Other Instructions Please keep a BP log for 2 weeks and send by MyChart or mail.                      Dr. Leiter [address] [place], [place] [address]  Blood Pressure Record Sheet To take your blood pressure, you will need a blood pressure machine. You can buy a blood pressure machine (blood pressure monitor) at your clinic, drug store, or online. When  choosing one, consider: An automatic monitor that has an arm cuff. A cuff that wraps snugly around your upper arm. You should be able to fit only one finger between your arm and the cuff. A device that stores blood pressure reading results. Do not choose a monitor that measures your blood pressure from your wrist or finger. Follow your health care provider's instructions for how to take your blood pressure. To use this form: Get one reading in the morning (a.m.) 1-2 hours after you take any medicines. Get one reading in the evening (p.m.) before supper.   Blood pressure log Date: _______________________  a.m. _____________________(1st reading) HR___________            p.m. _____________________(2nd reading) HR__________  Date: _______________________  a.m. _____________________(1st reading) HR___________            p.m. _____________________(2nd reading) HR__________  Date: _______________________  a.m. _____________________(1st reading) HR___________            p.m. _____________________(2nd reading) HR__________  Date: _______________________  a.m. _____________________(1st reading) HR___________            p.m. _____________________(2nd reading) HR__________  Date: _______________________  a.m. _____________________(1st reading) HR___________            p.m. _____________________(2nd reading) HR__________  Date: _______________________  a.m. _____________________(1st reading) HR___________            p.m. _____________________(2nd reading) HR__________  Date: _______________________  a.m. _____________________(1st  reading) HR___________            p.m. _____________________(2nd reading) HR__________   This information is not intended to replace advice given to you by your health care provider. Make sure you discuss any questions you have with your health care provider. Document Revised: 11/21/2019 Document Reviewed: 11/21/2019 Elsevier Patient Education   2021 [employer].

## 2024-07-20 ENCOUNTER — Ambulatory Visit (HOSPITAL_BASED_OUTPATIENT_CLINIC_OR_DEPARTMENT_OTHER)
Admission: EM | Admit: 2024-07-20 | Discharge: 2024-07-20 | Disposition: A | Discharge status: Home / Self Care | Attending: Family Medicine | Admitting: Family Medicine

## 2024-07-20 ENCOUNTER — Encounter (HOSPITAL_BASED_OUTPATIENT_CLINIC_OR_DEPARTMENT_OTHER): Payer: Self-pay

## 2024-07-20 ENCOUNTER — Ambulatory Visit (HOSPITAL_BASED_OUTPATIENT_CLINIC_OR_DEPARTMENT_OTHER): Admit: 2024-07-20 | Discharge: 2024-07-20 | Disposition: A | Discharge status: Home / Self Care | Admitting: Radiology

## 2024-07-20 DIAGNOSIS — M5442 Lumbago with sciatica, left side: Secondary | ICD-10-CM

## 2024-07-20 LAB — POCT URINE DIPSTICK
Bilirubin, UA: NEGATIVE
Blood, UA: NEGATIVE
Glucose, UA: NEGATIVE mg/dL
Ketones, POC UA: NEGATIVE mg/dL
Leukocytes, UA: NEGATIVE
Nitrite, UA: NEGATIVE
POC PROTEIN,UA: 30 — AB
Spec Grav, UA: 1.02 (ref 1.010–1.025)
Urobilinogen, UA: 0.2 U/dL
pH, UA: 6.5 (ref 5.0–8.0)

## 2024-07-20 MED ORDER — TIZANIDINE HCL 2 MG PO TABS: 2.0000 mg | ORAL_TABLET | Freq: Two times a day (BID) | ORAL | 0 refills | Status: AC | PRN

## 2024-07-20 NOTE — ED Triage Notes (Signed)
 States approx 1 week before Thanksgiving, patient bent over and felt pain to right hip/lower back. Rested on heat pad and took bayer and improved. This morning, patient began feeling severe pain to left hip/lower back. No known injury.  Patient reports no urinary symptoms. Took ibuprofen and tylenol  today.

## 2024-07-20 NOTE — ED Provider Notes (Signed)
 [facility]    CSN: [id number] Arrival date & time: 07/20/24  1355      History   Chief Complaint Chief Complaint  Patient presents with   Back Pain    HPI Steven Dodson is a 89 y.o. male.   States approx 1 week before Thanksgiving, patient bent over and felt pain to right hip/lower back. Rested on heat pad and took bayer and improved. This morning, patient began feeling severe pain to left hip/lower back. No known injury.  Patient reports no urinary symptoms. Took ibuprofen and tylenol  today.   Back Pain   Past Medical History:  Diagnosis Date   Acute blood loss anemia 10/07/2017   Acute GI bleeding 10/07/2017   Acute laryngotracheitis 12/03/2022   12/03/2022     Allergic rhinitis 05/11/2016   Anemia, macrocytic 11/17/2016   2018     Angioedema 05/02/2018   2019: right face, after flu shot     Arthritis    At high risk for injury related to fall 02/27/2021   Balanoposthitis 06/23/2020   Benign paroxysmal positional vertigo due to bilateral vestibular disorder 09/26/2018   Benign prostatic hyperplasia (BPH) with urinary urgency 10/08/2020   Bilateral cold feet 03/22/2023   03/22/2023     Calculus of gallbladder without cholecystitis without obstruction 01/30/2024   01/30/2024: recurrent pain, stones on CT     Cerebellar nystagmus 11/12/2015   2017: onset     Cough 02/04/2021   DDD (degenerative disc disease), lumbar 11/08/2015   Diverticulitis 06/16/2018   Diverticulosis 07/10/2023   Diverticulosis of colon with hemorrhage    Elevated PSA 11/08/2015   Managed UROL     Fatigue 11/16/2016   2018:     First degree heart block 05/19/2021   04/2021     Flank pain 12/20/2023   12/20/2023 : right     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   05/06/2021: ED eval, ZIO patch     GI bleed 06/16/2018   Glaucoma    Gout    Hematochezia    Hemorrhoids, external, thrombosed 08/03/2019   2020: 3OC     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   2019:  life-threatening, APT stopped     HLD (hyperlipidemia) 10/07/2017   Hyperlipidemia    Hyperplasia of prostate with lower urinary tract symptoms (LUTS) 02/24/2016   2022: declined urolift, cysto  07/28/2022: progressive, see UROL again     Hypertension    Increasing residual urine 01/26/2021   Insomnia 08/05/2020   Lactic acidosis 07/08/2023   Left lower quadrant pain 12/31/2021   12/31/2021     Macular degeneration 06/09/2016   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   Pain in both feet 07/28/2022   07/28/2022 : see POD     Polyp of intestine 07/10/2023   Prediabetes 11/08/2015   Rectal bleeding 07/08/2023   Sinus bradycardia 12/21/2023   Skin lesion of back 06/08/2019   Syncope 06/16/2018   Tinea corporis 06/08/2019   Transient ischemic attack (TIA) 12/24/2015   Umbilical hernia 05/11/2016   Unilateral recurrent inguinal hernia without obstruction or gangrene 03/29/2024   03/29/2024: recurrent right     Urinary symptom or sign 07/27/2022   07/27/2022     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    Patient Active Problem List   Diagnosis Date Noted   Unilateral recurrent inguinal hernia without obstruction or gangrene 03/29/2024   Calculus of gallbladder without cholecystitis without obstruction 01/30/2024   Sinus bradycardia 12/21/2023   Flank pain 12/20/2023   Diverticulosis 07/10/2023  Polyp of intestine 07/10/2023   Lactic acidosis 07/08/2023   Rectal bleeding 07/08/2023   Bilateral cold feet 03/22/2023   Acute laryngotracheitis 12/03/2022   Pain in both feet 07/28/2022   Urinary symptom or sign 07/27/2022   Left lower quadrant pain 12/31/2021   Arthritis 08/21/2021   Glaucoma 08/21/2021   First degree heart block 05/19/2021   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   At high risk for injury related to fall 02/27/2021   Cough 02/04/2021   Increasing residual urine 01/26/2021   Benign prostatic hyperplasia (BPH) with urinary urgency 10/08/2020   Insomnia 08/05/2020   Balanoposthitis  06/23/2020   Hemorrhoids, external, thrombosed 08/03/2019   Skin lesion of back 06/08/2019   Tinea corporis 06/08/2019   Benign paroxysmal positional vertigo due to bilateral vestibular disorder 09/26/2018   GI bleed 06/16/2018   Diverticulitis 06/16/2018   Syncope 06/16/2018   Hyperlipidemia    Angioedema 05/02/2018   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   Hematochezia    Diverticulosis of colon with hemorrhage    Acute GI bleeding 10/07/2017   Acute blood loss anemia 10/07/2017   Hypertension 10/07/2017   Gout 10/07/2017   HLD (hyperlipidemia) 10/07/2017   Anemia, macrocytic 11/17/2016   Fatigue 11/16/2016   Macular degeneration 06/09/2016   Allergic rhinitis 05/11/2016   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   Umbilical hernia 05/11/2016   Hyperplasia of prostate with lower urinary tract symptoms (LUTS) 02/24/2016   Transient ischemic attack (TIA) 12/24/2015   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   Cerebellar nystagmus 11/12/2015   DDD (degenerative disc disease), lumbar 11/08/2015   Elevated PSA 11/08/2015   Prediabetes 11/08/2015    Past Surgical History:  Procedure Laterality Date   navel base repair per pt     COLONOSCOPY N/A 10/08/2017   Procedure: COLONOSCOPY;  Surgeon: Shila Gustav GAILS, MD;  Location: [facility] ENDOSCOPY;  Service: Endoscopy;  Laterality: N/A;   ESOPHAGOGASTRODUODENOSCOPY N/A 10/07/2017   Procedure: ESOPHAGOGASTRODUODENOSCOPY (EGD);  Surgeon: Legrand Victory LITTIE DOUGLAS, MD;  Location: [facility] ENDOSCOPY;  Service: Gastroenterology;  Laterality: N/A;   HERNIA REPAIR         Home Medications    Prior to Admission medications   Medication Sig Start Date End Date Taking? Authorizing Provider  allopurinol  (ZYLOPRIM ) 300 MG tablet Take 300 mg by mouth daily.     [provider]  amLODipine  (NORVASC ) 5 MG tablet Take 5 mg by mouth daily. 06/25/21   [provider]  brimonidine  (ALPHAGAN ) 0.2 % ophthalmic solution Place 1  drop into both eyes 2 (two) times daily with breakfast and lunch.    [provider]  cetirizine (ZYRTEC) 10 MG tablet Take 10 mg by mouth daily.    [provider]  dorzolamide -timolol  (COSOPT ) 22.3-6.8 MG/ML ophthalmic solution Place 1 drop into the right eye 2 (two) times daily with breakfast and lunch. With breakfast and lunch     [provider]  finasteride (PROSCAR) 5 MG tablet Take 5 mg by mouth daily. 04/23/21   [provider]  labetalol  (NORMODYNE ) 100 MG tablet Take 100 mg by mouth every 12 (twelve) hours. 06/24/21   [provider]  lisinopril-hydrochlorothiazide (PRINZIDE,ZESTORETIC) 20-25 MG tablet Take 1 tablet by mouth daily.    [provider]  Multiple Vitamin (MULTIVITAMIN WITH MINERALS) TABS tablet Take 1 tablet by mouth daily.    [provider]  Netarsudil-Latanoprost  (ROCKLATAN) 0.02-0.005 % SOLN Place 1 drop into both eyes at bedtime. 05/09/23   [provider]  simvastatin (ZOCOR) 40 MG  tablet Take 40 mg by mouth at bedtime. 03/26/21   [provider]  tiZANidine  (ZANAFLEX ) 2 MG tablet Take 1 tablet (2 mg total) by mouth every 12 (twelve) hours as needed for muscle spasms. 07/20/24   Adah Wilbert LABOR, FNP    Family History Family History  Problem Relation Age of Onset   Heart failure Mother    CAD Father    Colon cancer Neg Hx     Social History Social History   Tobacco Use   Smoking status: Never    Passive exposure: Never   Smokeless tobacco: Never  Vaping Use   Vaping status: Never Used  Substance Use Topics   Alcohol use: No   Drug use: No     Allergies   Aspirin and Prednisone   Review of Systems Review of Systems  Musculoskeletal:  Positive for back pain.     Physical Exam Triage Vital Signs ED Triage Vitals  Encounter Vitals Group     BP 07/20/24 1415 (!) 186/67     Girls Systolic BP Percentile --      Girls Diastolic BP Percentile --      Boys Systolic BP  Percentile --      Boys Diastolic BP Percentile --      Pulse Rate 07/20/24 1415 67     Resp 07/20/24 1415 20     Temp 07/20/24 1415 97.8 F (36.6 C)     Temp Source 07/20/24 1415 Oral     SpO2 07/20/24 1415 94 %     Weight --      Height --      Head Circumference --      Peak Flow --      Pain Score 07/20/24 1417 10     Pain Loc --      Pain Education --      Exclude from Growth Chart --    No data found.  Updated Vital Signs BP (!) 186/67 (BP Location: Right Arm)   Pulse 67   Temp 97.8 F (36.6 C) (Oral)   Resp 20   SpO2 94%   Visual Acuity Right Eye Distance:   Left Eye Distance:   Bilateral Distance:    Right Eye Near:   Left Eye Near:    Bilateral Near:     Physical Exam Constitutional:      Appearance: Normal appearance.  Pulmonary:     Effort: Pulmonary effort is normal.  Musculoskeletal:     Lumbar back: Tenderness present. Decreased range of motion. Positive left straight leg raise test.       Back:  Neurological:     Mental Status: He is alert.  Psychiatric:        Mood and Affect: Mood normal.      UC Treatments / Results  Labs (all labs ordered are listed, but only abnormal results are displayed) Labs Reviewed  POCT URINE DIPSTICK - Abnormal; Notable for the following components:      Result Value   POC PROTEIN,UA =30 (*)    All other components within normal limits    EKG   Radiology DG Lumbar Spine Complete Result Date: 07/20/2024 CLINICAL DATA:  Low back pain. Acute midline low back pain with left-sided sciatica. EXAM: LUMBAR SPINE - COMPLETE 4+ VIEW COMPARISON:  05/03/2012 FINDINGS: Five non-rib-bearing lumbar vertebra. Mild broad-based dextroscoliotic curvature. Grade 1 anterolisthesis of L4 on L5. No evidence of fracture or compression deformity. Diffuse degenerative disc disease with disc space narrowing and  spurring. Lower lumbar facet hypertrophy. Prominent aortic and branch atherosclerosis. IMPRESSION: 1. Diffuse degenerative  disc disease and lower lumbar facet hypertrophy. 2. Grade 1 anterolisthesis of L4 on L5. Electronically Signed   By: Andrea Gasman M.D.   On: 07/20/2024 15:16    Procedures Procedures (including critical care time)  Medications Ordered in UC Medications - No data to display  Initial Impression / Assessment and Plan / UC Course  I have reviewed the triage vital signs and the nursing notes.  Pertinent labs & imaging results that were available during my care of the patient were reviewed by me and considered in my medical decision making (see chart for details).     Acute left-sided low back pain.  X-ray shows some degenerative disc disease but no fractures or bony abnormalities.  Most likely a strained muscle in the lower back area.  Recommend heat to the area.  Recommend Voltaren gel to rub on the area for pain.  He has taken Zanaflex  before in the past so I refilled this in case he needs this for muscle laxation. Follow-up as needed Final Clinical Impressions(s) / UC Diagnoses   Final diagnoses:  Acute midline low back pain with left-sided sciatica     Discharge Instructions      Your xray showed some degenerative disc disease. There are no fractures. I believe your strained a muscle. You can do heat to the area. Recommend Voltaren gel rub on the area for pain. I am refilling a muscle relaxer that you have had before this may help. See your PCP for follow up as needed.     ED Prescriptions     Medication Sig Dispense Auth. Provider   tiZANidine  (ZANAFLEX ) 2 MG tablet Take 1 tablet (2 mg total) by mouth every 12 (twelve) hours as needed for muscle spasms. 20 tablet Adah Wilbert LABOR, FNP      PDMP not reviewed this encounter.   Adah Wilbert LABOR, FNP 07/20/24 1929

## 2024-07-20 NOTE — Discharge Instructions (Signed)
 Your xray showed some degenerative disc disease. There are no fractures. I believe your strained a muscle. You can do heat to the area. Recommend Voltaren gel rub on the area for pain. I am refilling a muscle relaxer that you have had before this may help. See your PCP for follow up as needed.

## 2024-08-28 ENCOUNTER — Inpatient Hospital Stay: Admit: 2024-08-28 | Admitting: Family Medicine

## 2024-08-28 ENCOUNTER — Encounter (HOSPITAL_COMMUNITY): Payer: Self-pay
